# Patient Record
Sex: Male | Born: 1985 | Race: White | Hispanic: No | Smoking: Never smoker
Health system: Southern US, Community
[De-identification: ages and names within clinical notes are randomized; demographics above are authoritative.]

## PROBLEM LIST (undated history)

## (undated) DIAGNOSIS — M545 Low back pain, unspecified: Secondary | ICD-10-CM

## (undated) DIAGNOSIS — M79604 Pain in right leg: Secondary | ICD-10-CM

## (undated) DIAGNOSIS — M5417 Radiculopathy, lumbosacral region: Secondary | ICD-10-CM

## (undated) HISTORY — DX: Radiculopathy, lumbosacral region: M54.17

## (undated) HISTORY — DX: Pain in right leg: M79.604

## (undated) HISTORY — DX: Low back pain: M54.5

## (undated) HISTORY — DX: Low back pain, unspecified: M54.50

---

## 2019-03-02 ENCOUNTER — Other Ambulatory Visit: Payer: Self-pay | Admitting: Orthopedic Surgery

## 2019-03-03 ENCOUNTER — Encounter: Payer: Self-pay | Admitting: Vascular Surgery

## 2019-03-03 ENCOUNTER — Other Ambulatory Visit: Payer: Self-pay | Admitting: *Deleted

## 2019-03-08 ENCOUNTER — Telehealth (HOSPITAL_COMMUNITY): Payer: Self-pay | Admitting: Rehabilitation

## 2019-03-08 NOTE — Pre-Procedure Instructions (Addendum)
Ricky CopaKevin Norris  03/08/2019      [id number] - [place], [place] - [address] [facility] [address] [place] [id number] Phone: [phone] Fax: [phone]    Your procedure is scheduled on Wednesday, April 22nd.   [facility] at 5:30 A.M.  Call this number if you have problems the morning of surgery:  [phone]   Remember:  Do not eat after midnight.  You may drink clear liquids until 4:30 .  Clear liquids allowed are: Water, Juice (non-citric and without pulp), Carbonated beverages, Clear Tea, Black Coffee only, Plain Jell-O only and Gatorade  Please complete your PRE-SURGERY ENSURE that was provided to you 3 hours prior to you surgery start time.  Please, if able, drink it in one setting. DO NOT SIP.    Take these medicines the morning of surgery with A SIP OF WATER   Tramadol - if needed   7 days prior to surgery STOP taking any Aspirin (unless otherwise instructed by your surgeon), Aleve, Naproxen, Ibuprofen, Motrin, Advil, Goody's, BC's, all herbal medications, fish oil, and all vitamins.    Do not wear jewelry.  Do not wear lotions, powders, colognes, or deodorant.  Men may shave face and neck.  Do not bring valuables to the hospital.  [facility] is not responsible for any belongings or valuables.   [place]- Preparing For Surgery  Before surgery, you can play an important role. Because skin is not sterile, your skin needs to be as free of germs as possible. You can reduce the number of germs on your skin by washing with CHG (chlorahexidine gluconate) Soap before surgery.  CHG is an antiseptic cleaner which kills germs and bonds with the skin to continue killing germs even after washing.    Oral Hygiene is also important to reduce your risk of infection.  Remember - BRUSH YOUR TEETH THE MORNING OF SURGERY WITH YOUR REGULAR TOOTHPASTE  Please do not use if you have an allergy to CHG or  antibacterial soaps. If your skin becomes reddened/irritated stop using the CHG.  Do not shave (including legs and underarms) for at least 48 hours prior to first CHG shower. It is OK to shave your face.  Please follow these instructions carefully.   1. Shower the NIGHT BEFORE SURGERY and the MORNING OF SURGERY with CHG.   2. If you chose to wash your hair, wash your hair first as usual with your normal shampoo.  3. After you shampoo, rinse your hair and body thoroughly to remove the shampoo.  4. Use CHG as you would any other liquid soap. You can apply CHG directly to the skin and wash gently with a scrungie or a clean washcloth.   5. Apply the CHG Soap to your body ONLY FROM THE NECK DOWN.  Do not use on open wounds or open sores. Avoid contact with your eyes, ears, mouth and genitals (private parts). Wash Face and genitals (private parts)  with your normal soap.  6. Wash thoroughly, paying special attention to the area where your surgery will be performed.  7. Thoroughly rinse your body with warm water from the neck down.  8. DO NOT shower/wash with your normal soap after using and rinsing off the CHG Soap.  9. Pat yourself dry with a CLEAN TOWEL.  10. Wear CLEAN PAJAMAS to bed the night before surgery, wear comfortable clothes the morning of surgery  11. Place CLEAN  SHEETS on your bed the night of your first shower and DO NOT SLEEP WITH PETS.   Day of Surgery:  Do not apply any deodorants/lotions.  Please wear clean clothes to the hospital/surgery center.   Remember to brush your teeth WITH YOUR REGULAR TOOTHPASTE.   Contacts, dentures or bridgework may not be worn into surgery.  Leave your suitcase in the car.  After surgery it may be brought to your room.  For patients admitted to the hospital, discharge time will be determined by your treatment team.  Patients discharged the day of surgery will not be allowed to drive home.   Please read over the following fact sheets  that you were given. Coughing and Deep Breathing, MRSA Information and Surgical Site Infection Prevention

## 2019-03-08 NOTE — Telephone Encounter (Signed)
The above patient or their representative was contacted and gave the following answers to these questions:         Do you have any of the following symptoms? No  Fever                    Cough                   Shortness of breath  Do  you have any of the following other symptoms? No   muscle pain         vomiting,        diarrhea        rash         weakness        red eye        abdominal pain         bruising          bruising or bleeding              joint pain           severe headache    Have you been in contact with someone who was or has been sick in the past 2 weeks? No  Yes                 Unsure                         Unable to assess   Does the person that you were in contact with have any of the following symptoms?   Cough         shortness of breath           muscle pain         vomiting,            diarrhea            rash            weakness           fever            red eye           abdominal pain           bruising  or  bleeding                joint pain                severe headache               Have you  or someone you have been in contact with traveled internationally in th last month? No        If yes, which countries?   Have you  or someone you have been in contact with traveled outside [place] in th last month? No         If yes, which state and city?   COMMENTS OR ACTION PLAN FOR THIS PATIENT:          

## 2019-03-09 ENCOUNTER — Encounter (HOSPITAL_COMMUNITY)
Admission: RE | Admit: 2019-03-09 | Discharge: 2019-03-09 | Disposition: A | Discharge status: Home / Self Care | Payer: Commercial Managed Care - PPO | Source: Ambulatory Visit | Attending: Orthopedic Surgery | Admitting: Orthopedic Surgery

## 2019-03-09 ENCOUNTER — Other Ambulatory Visit: Payer: Self-pay

## 2019-03-09 ENCOUNTER — Ambulatory Visit (INDEPENDENT_AMBULATORY_CARE_PROVIDER_SITE_OTHER): Payer: Commercial Managed Care - PPO | Admitting: Vascular Surgery

## 2019-03-09 ENCOUNTER — Encounter (HOSPITAL_COMMUNITY): Payer: Self-pay

## 2019-03-09 ENCOUNTER — Encounter: Payer: Self-pay | Admitting: Vascular Surgery

## 2019-03-09 VITALS — BP 132/80 | HR 76 | Temp 97.3°F | Resp 20 | Ht 71.0 in | Wt 176.7 lb

## 2019-03-09 DIAGNOSIS — M5137 Other intervertebral disc degeneration, lumbosacral region: Secondary | ICD-10-CM

## 2019-03-09 DIAGNOSIS — Z01812 Encounter for preprocedural laboratory examination: Secondary | ICD-10-CM | POA: Insufficient documentation

## 2019-03-09 LAB — TYPE AND SCREEN
ABO/RH(D): B POS
Antibody Screen: NEGATIVE

## 2019-03-09 LAB — COMPREHENSIVE METABOLIC PANEL
ALT: 43 U/L (ref 0–44)
AST: 32 U/L (ref 15–41)
Albumin: 4.4 g/dL (ref 3.5–5.0)
Alkaline Phosphatase: 75 U/L (ref 38–126)
Anion gap: 10 (ref 5–15)
BUN: 7 mg/dL (ref 6–20)
CO2: 25 mmol/L (ref 22–32)
Calcium: 9.5 mg/dL (ref 8.9–10.3)
Chloride: 102 mmol/L (ref 98–111)
Creatinine, Ser: 0.98 mg/dL (ref 0.61–1.24)
GFR calc Af Amer: >60 mL/min (ref >60)
GFR calc non Af Amer: >60 mL/min (ref >60)
Glucose, Bld: 95 mg/dL (ref 70–99)
Potassium: 3.8 mmol/L (ref 3.5–5.1)
Sodium: 137 mmol/L (ref 135–145)
Total Bilirubin: 1 mg/dL (ref 0.3–1.2)
Total Protein: 7.7 g/dL (ref 6.5–8.1)

## 2019-03-09 LAB — CBC WITH DIFFERENTIAL/PLATELET
Abs Immature Granulocytes: 0.02 10*3/uL (ref 0.00–0.07)
Basophils Absolute: 0.1 10*3/uL (ref 0.0–0.1)
Basophils Relative: 1 %
Eosinophils Absolute: 0.1 10*3/uL (ref 0.0–0.5)
Eosinophils Relative: 1 %
HCT: 44.7 % (ref 39.0–52.0)
Hemoglobin: 15.6 g/dL (ref 13.0–17.0)
Immature Granulocytes: 0 %
Lymphocytes Relative: 20 %
Lymphs Abs: 1.6 10*3/uL (ref 0.7–4.0)
MCH: 32.3 pg (ref 26.0–34.0)
MCHC: 34.9 g/dL (ref 30.0–36.0)
MCV: 92.5 fL (ref 80.0–100.0)
Monocytes Absolute: 0.9 10*3/uL (ref 0.1–1.0)
Monocytes Relative: 11 %
Neutro Abs: 5.4 10*3/uL (ref 1.7–7.7)
Neutrophils Relative %: 67 %
Platelets: 353 10*3/uL (ref 150–400)
RBC: 4.83 MIL/uL (ref 4.22–5.81)
RDW: 12.4 % (ref 11.5–15.5)
WBC: 8 10*3/uL (ref 4.0–10.5)
nRBC: 0 % (ref 0.0–0.2)

## 2019-03-09 LAB — URINALYSIS, ROUTINE W REFLEX MICROSCOPIC
Bilirubin Urine: NEGATIVE
Glucose, UA: NEGATIVE mg/dL
Hgb urine dipstick: NEGATIVE
Ketones, ur: NEGATIVE mg/dL
Leukocytes,Ua: NEGATIVE
Nitrite: NEGATIVE
Protein, ur: NEGATIVE mg/dL
Specific Gravity, Urine: 1.018 (ref 1.005–1.030)
pH: 7 (ref 5.0–8.0)

## 2019-03-09 LAB — APTT: aPTT: 30 seconds (ref 24–36)

## 2019-03-09 LAB — ABO/RH: ABO/RH(D): B POS

## 2019-03-09 LAB — PROTIME-INR
INR: 1 (ref 0.8–1.2)
Prothrombin Time: 13 seconds (ref 11.4–15.2)

## 2019-03-09 NOTE — Progress Notes (Signed)
[facility]ist [facility] [place]  Patient name: Ricky Norris MRN: [id number] DOB: [birth year] Sex: male  REASON FOR CONSULT: Gus anterior exposure for L4-5 and L5-S1 disc surgery  HPI: Ricky Norris is a 33 y.o. male, who is here today for discussion of anterior exposure for lumbar fusion with Dr. Yevette Edwards.  He is very pleasant 33 year old gentleman who has had many years of progressive back pain.  This is now begin to have more pain in his right leg and is also now having progressive weakness in his right leg as well.  He has been seen by Dr. Yevette Edwards who is recommended both anterior and posterior instrumentation.  He is scheduled for L4-5 and L5-S1 anterior fusion tomorrow.  He has had no prior intra-abdominal surgery.  Did have surgery on his fingers as a child.  He works in a [employer] and is makes it very difficult for him to do his job with his back pain and right leg pain and weakness.  Past Medical History:  Diagnosis Date  . Low back pain   . 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   . Right leg pain     History reviewed. No pertinent family history.  SOCIAL HISTORY: Social History   Socioeconomic History  . Marital status: Divorced    Spouse name: Not on file  . Number of children: Not on file  . Years of education: Not on file  . Highest education level: Not on file  Occupational History  . Not on file  Social Needs  . Financial resource strain: Not on file  . Food insecurity:    Worry: Not on file    Inability: Not on file  . Transportation needs:    Medical: Not on file    Non-medical: Not on file  Tobacco Use  . Smoking status: Never Smoker  . Smokeless tobacco: Never Used  Substance and Sexual Activity  . Alcohol use: Yes  . Drug use: Yes    Types: Marijuana  . Sexual activity: Not on file  Lifestyle  . Physical  activity:    Days per week: Not on file    Minutes per session: Not on file  . Stress: Not on file  Relationships  . Social connections:    Talks on phone: Not on file    Gets together: Not on file    Attends religious service: Not on file    Active member of club or organization: Not on file    Attends meetings of clubs or organizations: Not on file    Relationship status: Not on file  . Intimate partner violence:    Fear of current or ex partner: Not on file    Emotionally abused: Not on file    Physically abused: Not on file    Forced sexual activity: Not on file  Other Topics Concern  . Not on file  Social History Narrative  . Not on file    No Known Allergies  Current Outpatient Medications  Medication Sig Dispense Refill  . methocarbamol (ROBAXIN) 500 MG tablet Take 500 mg by mouth daily.     . traMADol (ULTRAM) 50 MG tablet Take 50 mg by mouth daily.      No current facility-administered medications for this visit.     REVIEW OF SYSTEMS:  [X]  denotes positive finding, [ ]  denotes negative finding Cardiac  Comments:  Chest  pain or chest pressure:    Shortness of breath upon exertion:    Short of breath when lying flat:    Irregular heart rhythm:        Vascular    Pain in calf, thigh, or hip brought on by ambulation:    Pain in feet at night that wakes you up from your sleep:     Blood clot in your veins:    Leg swelling:         Pulmonary    Oxygen at home:    Productive cough:     Wheezing:         Neurologic    Sudden weakness in arms or legs:     Sudden numbness in arms or legs:     Sudden onset of difficulty speaking or slurred speech:    Temporary loss of vision in one eye:     Problems with dizziness:         Gastrointestinal    Blood in stool:     Vomited blood:         Genitourinary    Burning when urinating:     Blood in urine:        Psychiatric    Major depression:         Hematologic    Bleeding problems:    Problems with  blood clotting too easily:        Skin    Rashes or ulcers:        Constitutional    Fever or chills:      PHYSICAL EXAM: Vitals:   03/09/19 0814  BP: 132/80  Pulse: 76  Resp: 20  Temp: (!) 97.3 F (36.3 C)  SpO2: 96%  Weight: 176 lb 11.2 oz (80.2 kg)  Height: 5\' 11"  (1.803 m)    GENERAL: The patient is a well-nourished male, in no acute distress. The vital signs are documented above. CARDIOVASCULAR: 2+ radial and 2+ dorsalis pedis pulses bilaterally PULMONARY: There is good air exchange  ABDOMEN: Soft and non-tender  MUSCULOSKELETAL: There are no major deformities or cyanosis. NEUROLOGIC: No focal weakness or paresthesias are detected. SKIN: There are no ulcers or rashes noted. PSYCHIATRIC: The patient has a normal affect.  DATA:  None  MEDICAL ISSUES: I discussed operative procedure with the patient.  Explained the Dr. Yevette Edwardsumonski is determined that he would be best treated with surgery and that component of this would be related to anterior exposure.  I explained the incision and mobilization of the rectus muscle, retroperitoneal contents, left ureter and arterial and venous structures overlying the spine.  Discussed the potential to all these and in particular discussed venous injury.  Also discussed retrograde ejaculation which he understands the slight risk of this.  He is ready to proceed with surgery as scheduled.  We will plan this tomorrow.   Larina Earthlyodd F. , MD FACS [facility] Office Tel [phone] Pager [phone]

## 2019-03-09 NOTE — Progress Notes (Signed)
PCP - Denies  Cardiologist - Denies  Chest x-ray - NA  EKG - NA  Stress Test - Denies  ECHO - Denies  Cardiac Cath - Denies  AICD-Denies PM-Denies LOOP-Denies  Sleep Study - Denies CPAP - NA  LABS-CBC,CMP,T/S,UA,PT,PTT  ASA-Denies  HA1C-NA Fasting Blood Sugar -  Checks Blood Sugar _____ times a day  Anesthesia-N  ERAS-Ensure given to patient with instructions.Pt stated he does not have any family or friends to pick him up after discharge. RN advised pt that it would be a good idea to have someone pick him up at time of discharge and to make arrangements for the same. Pt denies having chest pain, sob, or fever at this time. All instructions explained to the pt, with a verbal understanding of the material. Pt agrees to go over the instructions while at home for a better understanding. The opportunity to ask questions was provided.

## 2019-03-09 NOTE — Progress Notes (Addendum)

## 2019-03-09 NOTE — Anesthesia Preprocedure Evaluation (Addendum)
Anesthesia Evaluation    Reviewed: Allergy & Precautions, Patient's Chart, lab work & pertinent test results  Airway Mallampati: II  TM Distance: >3 FB Neck ROM: Full    Dental no notable dental hx.    Pulmonary neg pulmonary ROS,    Pulmonary exam normal breath sounds clear to auscultation       Cardiovascular Exercise Tolerance: Good negative cardio ROS Normal cardiovascular exam Rhythm:Regular Rate:Normal     Neuro/Psych R leg ridiculopathy  Neuromuscular disease    GI/Hepatic negative GI ROS, Neg liver ROS,   Endo/Other  negative endocrine ROS  Renal/GU negative Renal ROS     Musculoskeletal   Abdominal   Peds  Hematology   Anesthesia Other Findings   Reproductive/Obstetrics                            Anesthesia Physical Anesthesia Plan  ASA: II  Anesthesia Plan: General   Post-op Pain Management:    Induction: Cricoid pressure planned, Rapid sequence and Intravenous  PONV Risk Score and Plan: 2 and Treatment may vary due to age or medical condition, Ondansetron, Dexamethasone and Midazolam  Airway Management Planned: Oral ETT  Additional Equipment:   Intra-op Plan:   Post-operative Plan: Extubation in OR  Informed Consent:     Dental advisory given  Plan Discussed with:   Anesthesia Plan Comments: (GA)        Anesthesia Quick Evaluation

## 2019-03-10 ENCOUNTER — Inpatient Hospital Stay (HOSPITAL_COMMUNITY)
Admission: RE | Admit: 2019-03-10 | Discharge: 2019-03-12 | DRG: 455 | Disposition: A | Discharge status: Home / Self Care | Payer: Commercial Managed Care - PPO | Attending: Orthopedic Surgery | Admitting: Orthopedic Surgery

## 2019-03-10 ENCOUNTER — Inpatient Hospital Stay (HOSPITAL_COMMUNITY): Payer: Commercial Managed Care - PPO | Admitting: Anesthesiology

## 2019-03-10 ENCOUNTER — Encounter (HOSPITAL_COMMUNITY)
Admission: RE | Disposition: A | Discharge status: Home / Self Care | Payer: Self-pay | Source: Home / Self Care | Attending: Orthopedic Surgery

## 2019-03-10 ENCOUNTER — Inpatient Hospital Stay (HOSPITAL_COMMUNITY): Payer: Commercial Managed Care - PPO

## 2019-03-10 ENCOUNTER — Encounter (HOSPITAL_COMMUNITY): Payer: Self-pay | Admitting: *Deleted

## 2019-03-10 DIAGNOSIS — Z79899 Other long term (current) drug therapy: Secondary | ICD-10-CM | POA: Diagnosis not present

## 2019-03-10 DIAGNOSIS — M545 Low back pain: Secondary | ICD-10-CM | POA: Diagnosis present

## 2019-03-10 DIAGNOSIS — M5416 Radiculopathy, lumbar region: Principal | ICD-10-CM | POA: Diagnosis present

## 2019-03-10 DIAGNOSIS — M4807 Spinal stenosis, lumbosacral region: Secondary | ICD-10-CM | POA: Diagnosis present

## 2019-03-10 DIAGNOSIS — M5137 Other intervertebral disc degeneration, lumbosacral region: Secondary | ICD-10-CM | POA: Diagnosis not present

## 2019-03-10 DIAGNOSIS — M4316 Spondylolisthesis, lumbar region: Secondary | ICD-10-CM | POA: Diagnosis present

## 2019-03-10 DIAGNOSIS — M4317 Spondylolisthesis, lumbosacral region: Secondary | ICD-10-CM | POA: Diagnosis present

## 2019-03-10 DIAGNOSIS — M48061 Spinal stenosis, lumbar region without neurogenic claudication: Secondary | ICD-10-CM | POA: Diagnosis present

## 2019-03-10 DIAGNOSIS — M5136 Other intervertebral disc degeneration, lumbar region: Secondary | ICD-10-CM | POA: Diagnosis not present

## 2019-03-10 DIAGNOSIS — M5410 Radiculopathy, site unspecified: Secondary | ICD-10-CM | POA: Diagnosis present

## 2019-03-10 DIAGNOSIS — Z419 Encounter for procedure for purposes other than remedying health state, unspecified: Secondary | ICD-10-CM

## 2019-03-10 HISTORY — PX: ABDOMINAL EXPOSURE: SHX5708

## 2019-03-10 HISTORY — PX: ANTERIOR LUMBAR FUSION: SHX1170

## 2019-03-10 SURGERY — ANTERIOR LUMBAR FUSION 2 LEVELS
Anesthesia: General | Site: Spine Lumbar

## 2019-03-10 MED ORDER — KETAMINE HCL 50 MG/5ML IJ SOSY
PREFILLED_SYRINGE | INTRAMUSCULAR | Status: AC
  Filled 2019-03-10: qty 5

## 2019-03-10 MED ORDER — SODIUM CHLORIDE 0.9% FLUSH: 3.0000 mL | INTRAVENOUS | Status: DC | PRN

## 2019-03-10 MED ORDER — THROMBIN 20000 UNITS EX SOLR
CUTANEOUS | Status: DC | PRN
  Administered 2019-03-10: 20000 [IU] via TOPICAL

## 2019-03-10 MED ORDER — ONDANSETRON HCL 4 MG/2ML IJ SOLN
INTRAMUSCULAR | Status: DC | PRN
  Administered 2019-03-10: 4 mg via INTRAVENOUS

## 2019-03-10 MED ORDER — GABAPENTIN 300 MG PO CAPS
300.0000 mg | ORAL_CAPSULE | Freq: Once | ORAL | Status: AC
  Administered 2019-03-10: 06:00:00 300 mg via ORAL
  Filled 2019-03-10: qty 1

## 2019-03-10 MED ORDER — ONDANSETRON HCL 4 MG/2ML IJ SOLN: 4.0000 mg | Freq: Once | INTRAMUSCULAR | Status: DC | PRN

## 2019-03-10 MED ORDER — LIDOCAINE HCL (CARDIAC) PF 100 MG/5ML IV SOSY
PREFILLED_SYRINGE | INTRAVENOUS | Status: DC | PRN
  Administered 2019-03-10: 60 mg via INTRAVENOUS

## 2019-03-10 MED ORDER — ACETAMINOPHEN 325 MG PO TABS
650.0000 mg | ORAL_TABLET | ORAL | Status: DC | PRN
  Administered 2019-03-10: 650 mg via ORAL
  Filled 2019-03-10: qty 2

## 2019-03-10 MED ORDER — ALBUMIN HUMAN 5 % IV SOLN
INTRAVENOUS | Status: DC | PRN
  Administered 2019-03-10: 11:00:00 via INTRAVENOUS

## 2019-03-10 MED ORDER — 0.9 % SODIUM CHLORIDE (POUR BTL) OPTIME
TOPICAL | Status: DC | PRN
  Administered 2019-03-10 (×2): 1000 mL

## 2019-03-10 MED ORDER — ONDANSETRON HCL 4 MG PO TABS: 4.0000 mg | ORAL_TABLET | Freq: Four times a day (QID) | ORAL | Status: DC | PRN

## 2019-03-10 MED ORDER — SUGAMMADEX SODIUM 200 MG/2ML IV SOLN
INTRAVENOUS | Status: DC | PRN
  Administered 2019-03-10: 200 mg via INTRAVENOUS

## 2019-03-10 MED ORDER — FENTANYL CITRATE (PF) 250 MCG/5ML IJ SOLN
INTRAMUSCULAR | Status: AC
  Filled 2019-03-10: qty 5

## 2019-03-10 MED ORDER — KETAMINE HCL 10 MG/ML IJ SOLN
INTRAMUSCULAR | Status: DC | PRN
  Administered 2019-03-10: 10 mg via INTRAVENOUS
  Administered 2019-03-10: 20 mg via INTRAVENOUS
  Administered 2019-03-10 (×2): 10 mg via INTRAVENOUS

## 2019-03-10 MED ORDER — OXYCODONE-ACETAMINOPHEN 5-325 MG PO TABS
1.0000 | ORAL_TABLET | ORAL | Status: DC | PRN
  Administered 2019-03-10 – 2019-03-12 (×6): 2 via ORAL
  Filled 2019-03-10 (×7): qty 2

## 2019-03-10 MED ORDER — THROMBIN 20000 UNITS EX SOLR: CUTANEOUS | Status: DC | PRN

## 2019-03-10 MED ORDER — DOCUSATE SODIUM 100 MG PO CAPS
100.0000 mg | ORAL_CAPSULE | Freq: Two times a day (BID) | ORAL | Status: DC
  Administered 2019-03-10 – 2019-03-12 (×3): 100 mg via ORAL
  Filled 2019-03-10 (×3): qty 1

## 2019-03-10 MED ORDER — SENNOSIDES-DOCUSATE SODIUM 8.6-50 MG PO TABS: 1.0000 | ORAL_TABLET | Freq: Every evening | ORAL | Status: DC | PRN

## 2019-03-10 MED ORDER — HYDROCODONE-ACETAMINOPHEN 7.5-325 MG PO TABS: 1.0000 | ORAL_TABLET | Freq: Once | ORAL | Status: DC | PRN

## 2019-03-10 MED ORDER — DEXAMETHASONE SODIUM PHOSPHATE 4 MG/ML IJ SOLN
INTRAMUSCULAR | Status: DC | PRN
  Administered 2019-03-10: 10 mg via INTRAVENOUS

## 2019-03-10 MED ORDER — MENTHOL 3 MG MT LOZG: 1.0000 | LOZENGE | OROMUCOSAL | Status: DC | PRN

## 2019-03-10 MED ORDER — PROPOFOL 10 MG/ML IV BOLUS
INTRAVENOUS | Status: AC
  Filled 2019-03-10: qty 20

## 2019-03-10 MED ORDER — ACETAMINOPHEN 500 MG PO TABS
1000.0000 mg | ORAL_TABLET | Freq: Once | ORAL | Status: AC
  Administered 2019-03-10: 1000 mg via ORAL
  Filled 2019-03-10: qty 2

## 2019-03-10 MED ORDER — ALUM & MAG HYDROXIDE-SIMETH 200-200-20 MG/5ML PO SUSP: 30.0000 mL | Freq: Four times a day (QID) | ORAL | Status: DC | PRN

## 2019-03-10 MED ORDER — CEFAZOLIN SODIUM-DEXTROSE 2-4 GM/100ML-% IV SOLN
2.0000 g | INTRAVENOUS | Status: AC
  Administered 2019-03-10 (×2): 2 g via INTRAVENOUS
  Filled 2019-03-10: qty 100

## 2019-03-10 MED ORDER — BUPIVACAINE-EPINEPHRINE 0.25% -1:200000 IJ SOLN: INTRAMUSCULAR | Status: DC | PRN

## 2019-03-10 MED ORDER — FLEET ENEMA 7-19 GM/118ML RE ENEM: 1.0000 | ENEMA | Freq: Once | RECTAL | Status: DC | PRN

## 2019-03-10 MED ORDER — MIDAZOLAM HCL 5 MG/5ML IJ SOLN
INTRAMUSCULAR | Status: DC | PRN
  Administered 2019-03-10: 2 mg via INTRAVENOUS

## 2019-03-10 MED ORDER — HYDROMORPHONE HCL 1 MG/ML IJ SOLN
0.2500 mg | INTRAMUSCULAR | Status: DC | PRN
  Administered 2019-03-10 (×2): 0.5 mg via INTRAVENOUS

## 2019-03-10 MED ORDER — BISACODYL 5 MG PO TBEC: 5.0000 mg | DELAYED_RELEASE_TABLET | Freq: Every day | ORAL | Status: DC | PRN

## 2019-03-10 MED ORDER — CEFAZOLIN SODIUM-DEXTROSE 2-4 GM/100ML-% IV SOLN
2.0000 g | Freq: Three times a day (TID) | INTRAVENOUS | Status: AC
  Administered 2019-03-10 – 2019-03-11 (×2): 2 g via INTRAVENOUS
  Filled 2019-03-10 (×2): qty 100

## 2019-03-10 MED ORDER — PROPOFOL 10 MG/ML IV BOLUS
INTRAVENOUS | Status: DC | PRN
  Administered 2019-03-10: 200 mg via INTRAVENOUS

## 2019-03-10 MED ORDER — SODIUM CHLORIDE 0.9% FLUSH
3.0000 mL | Freq: Two times a day (BID) | INTRAVENOUS | Status: DC
  Administered 2019-03-10 – 2019-03-12 (×3): 3 mL via INTRAVENOUS

## 2019-03-10 MED ORDER — THROMBIN 20000 UNITS EX SOLR
CUTANEOUS | Status: AC
  Filled 2019-03-10: qty 20000

## 2019-03-10 MED ORDER — SODIUM CHLORIDE 0.9 % IV SOLN: 250.0000 mL | INTRAVENOUS | Status: DC

## 2019-03-10 MED ORDER — BUPIVACAINE-EPINEPHRINE (PF) 0.25% -1:200000 IJ SOLN
INTRAMUSCULAR | Status: AC
  Filled 2019-03-10: qty 30

## 2019-03-10 MED ORDER — LACTATED RINGERS IV SOLN
INTRAVENOUS | Status: DC | PRN
  Administered 2019-03-10: 07:00:00 via INTRAVENOUS

## 2019-03-10 MED ORDER — LACTATED RINGERS IV SOLN
INTRAVENOUS | Status: DC | PRN
  Administered 2019-03-10 (×2): via INTRAVENOUS

## 2019-03-10 MED ORDER — SUCCINYLCHOLINE CHLORIDE 20 MG/ML IJ SOLN
INTRAMUSCULAR | Status: DC | PRN
  Administered 2019-03-10: 120 mg via INTRAVENOUS

## 2019-03-10 MED ORDER — ROCURONIUM BROMIDE 100 MG/10ML IV SOLN
INTRAVENOUS | Status: DC | PRN
  Administered 2019-03-10: 30 mg via INTRAVENOUS
  Administered 2019-03-10: 50 mg via INTRAVENOUS
  Administered 2019-03-10: 20 mg via INTRAVENOUS

## 2019-03-10 MED ORDER — FENTANYL CITRATE (PF) 100 MCG/2ML IJ SOLN
INTRAMUSCULAR | Status: DC | PRN
  Administered 2019-03-10 (×7): 50 ug via INTRAVENOUS
  Administered 2019-03-10: 100 ug via INTRAVENOUS
  Administered 2019-03-10: 50 ug via INTRAVENOUS

## 2019-03-10 MED ORDER — POTASSIUM CHLORIDE IN NACL 20-0.9 MEQ/L-% IV SOLN
INTRAVENOUS | Status: DC
  Administered 2019-03-10: 18:00:00 via INTRAVENOUS
  Filled 2019-03-10: qty 1000

## 2019-03-10 MED ORDER — MORPHINE SULFATE (PF) 2 MG/ML IV SOLN: 1.0000 mg | INTRAVENOUS | Status: DC | PRN

## 2019-03-10 MED ORDER — HEMOSTATIC AGENTS (NO CHARGE) OPTIME
TOPICAL | Status: DC | PRN
  Administered 2019-03-10: 1 via TOPICAL

## 2019-03-10 MED ORDER — CHLORHEXIDINE GLUCONATE 4 % EX LIQD: 60.0000 mL | Freq: Once | CUTANEOUS | Status: DC

## 2019-03-10 MED ORDER — ACETAMINOPHEN 650 MG RE SUPP: 650.0000 mg | RECTAL | Status: DC | PRN

## 2019-03-10 MED ORDER — PROPOFOL 500 MG/50ML IV EMUL
INTRAVENOUS | Status: DC | PRN
  Administered 2019-03-10: 50 ug/kg/min via INTRAVENOUS

## 2019-03-10 MED ORDER — ESMOLOL HCL 100 MG/10ML IV SOLN
INTRAVENOUS | Status: AC
  Filled 2019-03-10: qty 10

## 2019-03-10 MED ORDER — ONDANSETRON HCL 4 MG/2ML IJ SOLN
4.0000 mg | Freq: Four times a day (QID) | INTRAMUSCULAR | Status: DC | PRN
  Administered 2019-03-10: 18:00:00 4 mg via INTRAVENOUS
  Filled 2019-03-10: qty 2

## 2019-03-10 MED ORDER — POVIDONE-IODINE 7.5 % EX SOLN
Freq: Once | CUTANEOUS | Status: AC
  Administered 2019-03-10: 06:00:00 via TOPICAL
  Filled 2019-03-10: qty 118

## 2019-03-10 MED ORDER — MIDAZOLAM HCL 2 MG/2ML IJ SOLN
INTRAMUSCULAR | Status: AC
  Filled 2019-03-10: qty 2

## 2019-03-10 MED ORDER — HYDROMORPHONE HCL 1 MG/ML IJ SOLN
INTRAMUSCULAR | Status: AC
  Filled 2019-03-10: qty 1

## 2019-03-10 MED ORDER — ZOLPIDEM TARTRATE 5 MG PO TABS: 5.0000 mg | ORAL_TABLET | Freq: Every evening | ORAL | Status: DC | PRN

## 2019-03-10 MED ORDER — PHENOL 1.4 % MT LIQD: 1.0000 | OROMUCOSAL | Status: DC | PRN

## 2019-03-10 MED ORDER — MEPERIDINE HCL 50 MG/ML IJ SOLN: 6.2500 mg | INTRAMUSCULAR | Status: DC | PRN

## 2019-03-10 MED ORDER — DIAZEPAM 5 MG PO TABS
5.0000 mg | ORAL_TABLET | Freq: Four times a day (QID) | ORAL | Status: DC | PRN
  Administered 2019-03-10 – 2019-03-12 (×5): 5 mg via ORAL
  Filled 2019-03-10 (×4): qty 1

## 2019-03-10 SURGICAL SUPPLY — 96 items
APPLIER CLIP 11 MED OPEN (CLIP) ×6
BENZOIN TINCTURE PRP APPL 2/3 (GAUZE/BANDAGES/DRESSINGS) ×3 IMPLANT
BLADE CLIPPER SURG (BLADE) IMPLANT
BLADE SURG 10 STRL SS (BLADE) ×3 IMPLANT
CLIP APPLIE 11 MED OPEN (CLIP) ×4 IMPLANT
CLIP LIGATING EXTRA MED SLVR (CLIP) ×3 IMPLANT
CLIP LIGATING EXTRA SM BLUE (MISCELLANEOUS) ×3 IMPLANT
CORDS BIPOLAR (ELECTRODE) ×3 IMPLANT
COVER SURGICAL LIGHT HANDLE (MISCELLANEOUS) ×3 IMPLANT
COVER WAND RF STERILE (DRAPES) ×6 IMPLANT
DERMABOND ADVANCED (GAUZE/BANDAGES/DRESSINGS)
DERMABOND ADVANCED .7 DNX12 (GAUZE/BANDAGES/DRESSINGS) IMPLANT
DRAPE C-ARM 42X72 X-RAY (DRAPES) ×6 IMPLANT
DRAPE C-ARMOR (DRAPES) ×3 IMPLANT
DRAPE POUCH INSTRU U-SHP 10X18 (DRAPES) ×3 IMPLANT
DRAPE SURG 17X23 STRL (DRAPES) ×9 IMPLANT
DRSG MEPILEX BORDER 4X12 (GAUZE/BANDAGES/DRESSINGS) ×3 IMPLANT
DURAPREP 26ML APPLICATOR (WOUND CARE) ×3 IMPLANT
ELECT BLADE 4.0 EZ CLEAN MEGAD (MISCELLANEOUS) ×6
ELECT CAUTERY BLADE 6.4 (BLADE) ×3 IMPLANT
ELECT REM PT RETURN 9FT ADLT (ELECTROSURGICAL) ×3
ELECTRODE BLDE 4.0 EZ CLN MEGD (MISCELLANEOUS) ×4 IMPLANT
ELECTRODE REM PT RTRN 9FT ADLT (ELECTROSURGICAL) ×2 IMPLANT
FEE INTRAOP MONITOR IMPULS NCS (MISCELLANEOUS) ×2 IMPLANT
GAUZE 4X4 16PLY RFD (DISPOSABLE) IMPLANT
GAUZE SPONGE 4X4 12PLY STRL LF (GAUZE/BANDAGES/DRESSINGS) ×3 IMPLANT
GLOVE BIO SURGEON STRL SZ7 (GLOVE) ×3 IMPLANT
GLOVE BIO SURGEON STRL SZ8 (GLOVE) ×3 IMPLANT
GLOVE BIOGEL PI IND STRL 7.0 (GLOVE) ×2 IMPLANT
GLOVE BIOGEL PI IND STRL 8 (GLOVE) ×4 IMPLANT
GLOVE BIOGEL PI INDICATOR 7.0 (GLOVE) ×1
GLOVE BIOGEL PI INDICATOR 8 (GLOVE) ×2
GLOVE INDICATOR 7.5 STRL GRN (GLOVE) ×3 IMPLANT
GLOVE SS BIOGEL STRL SZ 7.5 (GLOVE) ×2 IMPLANT
GLOVE SUPERSENSE BIOGEL SZ 7.5 (GLOVE) ×1
GLOVE SURG SS PI 6.5 STRL IVOR (GLOVE) ×3 IMPLANT
GOWN STRL REUS W/ TWL LRG LVL3 (GOWN DISPOSABLE) ×6 IMPLANT
GOWN STRL REUS W/ TWL XL LVL3 (GOWN DISPOSABLE) ×6 IMPLANT
GOWN STRL REUS W/TWL LRG LVL3 (GOWN DISPOSABLE) ×3
GOWN STRL REUS W/TWL XL LVL3 (GOWN DISPOSABLE) ×3
HEMOSTAT SURGICEL 2X14 (HEMOSTASIS) IMPLANT
INSERT FOGARTY 61MM (MISCELLANEOUS) IMPLANT
INSERT FOGARTY SM (MISCELLANEOUS) IMPLANT
INTRAOP MONITOR FEE IMPULS NCS (MISCELLANEOUS) ×2
INTRAOP MONITOR FEE IMPULSE (MISCELLANEOUS) ×1
KIT BASIN OR (CUSTOM PROCEDURE TRAY) ×3 IMPLANT
KIT TURNOVER KIT B (KITS) ×3 IMPLANT
LOOP VESSEL MAXI BLUE (MISCELLANEOUS) IMPLANT
LOOP VESSEL MINI RED (MISCELLANEOUS) IMPLANT
MIX DBX 10CC 35% BONE (Bone Implant) ×6 IMPLANT
NEEDLE HYPO 25GX1X1/2 BEV (NEEDLE) ×3 IMPLANT
NEEDLE SPNL 18GX3.5 QUINCKE PK (NEEDLE) ×3 IMPLANT
NS IRRIG 1000ML POUR BTL (IV SOLUTION) ×3 IMPLANT
PACK LAMINECTOMY ORTHO (CUSTOM PROCEDURE TRAY) IMPLANT
PACK UNIVERSAL I (CUSTOM PROCEDURE TRAY) ×3 IMPLANT
PAD ARMBOARD 7.5X6 YLW CONV (MISCELLANEOUS) ×6 IMPLANT
PATTIES SURGICAL .5 X1 (DISPOSABLE) ×3 IMPLANT
SPONGE INTESTINAL PEANUT (DISPOSABLE) ×9 IMPLANT
SPONGE LAP 18X18 RF (DISPOSABLE) IMPLANT
SPONGE LAP 18X18 X RAY DECT (DISPOSABLE) ×3 IMPLANT
SPONGE LAP 4X18 RFD (DISPOSABLE) IMPLANT
SPONGE SURGIFOAM ABS GEL 100 (HEMOSTASIS) ×3 IMPLANT
STAPLER VISISTAT 35W (STAPLE) IMPLANT
STRIP CLOSURE SKIN 1/2X4 (GAUZE/BANDAGES/DRESSINGS) ×3 IMPLANT
SURGIFLO W/THROMBIN 8M KIT (HEMOSTASIS) IMPLANT
SUT MNCRL AB 4-0 PS2 18 (SUTURE) ×3 IMPLANT
SUT PDS AB 1 CTX 36 (SUTURE) ×6 IMPLANT
SUT PROLENE 4 0 RB 1 (SUTURE)
SUT PROLENE 4-0 RB1 .5 CRCL 36 (SUTURE) IMPLANT
SUT PROLENE 5 0 C 1 24 (SUTURE) IMPLANT
SUT PROLENE 5 0 CC1 (SUTURE) IMPLANT
SUT PROLENE 6 0 C 1 30 (SUTURE) ×3 IMPLANT
SUT PROLENE 6 0 CC (SUTURE) IMPLANT
SUT SILK 0 TIES 10X30 (SUTURE) ×3 IMPLANT
SUT SILK 2 0 TIES 10X30 (SUTURE) ×6 IMPLANT
SUT SILK 2 0SH CR/8 30 (SUTURE) IMPLANT
SUT SILK 3 0 TIES 10X30 (SUTURE) ×6 IMPLANT
SUT SILK 3 0SH CR/8 30 (SUTURE) IMPLANT
SUT VIC AB 1 CT1 18XCR BRD 8 (SUTURE) ×2 IMPLANT
SUT VIC AB 1 CT1 27 (SUTURE) ×2
SUT VIC AB 1 CT1 27XBRD ANBCTR (SUTURE) ×4 IMPLANT
SUT VIC AB 1 CT1 8-18 (SUTURE) ×1
SUT VIC AB 1 CTX 36 (SUTURE) ×2
SUT VIC AB 1 CTX36XBRD ANBCTR (SUTURE) ×4 IMPLANT
SUT VIC AB 2-0 CT2 18 VCP726D (SUTURE) ×6 IMPLANT
SUT VIC AB 3-0 SH 27 (SUTURE) ×1
SUT VIC AB 3-0 SH 27X BRD (SUTURE) ×2 IMPLANT
SYNCAGE EVOL MD 12X10.1 6D (Spacer) ×3 IMPLANT
SYNCAGE EVOL SM 17X12.4 14D (Spacer) ×3 IMPLANT
SYR BULB IRRIGATION 50ML (SYRINGE) ×3 IMPLANT
TOWEL GREEN STERILE (TOWEL DISPOSABLE) ×3 IMPLANT
TOWEL OR 17X24 6PK STRL BLUE (TOWEL DISPOSABLE) ×3 IMPLANT
TOWEL OR 17X26 10 PK STRL BLUE (TOWEL DISPOSABLE) ×3 IMPLANT
TRAY FOLEY CATH SILVER 16FR (SET/KITS/TRAYS/PACK) ×3 IMPLANT
WATER STERILE IRR 1000ML POUR (IV SOLUTION) ×3 IMPLANT
YANKAUER SUCT BULB TIP NO VENT (SUCTIONS) ×3 IMPLANT

## 2019-03-10 NOTE — Anesthesia Procedure Notes (Signed)
Procedure Name: Intubation Date/Time: 03/10/2019 7:46 AM Performed by: Tillman Abide, CRNA Pre-anesthesia Checklist: Patient identified, Emergency Drugs available, Suction available and Patient being monitored Patient Re-evaluated:Patient Re-evaluated prior to induction Oxygen Delivery Method: Circle System Utilized Preoxygenation: Pre-oxygenation with 100% oxygen Induction Type: IV induction Ventilation: Mask ventilation without difficulty Laryngoscope Size: Miller and 3 Tube type: Oral Tube size: 7.5 mm Number of attempts: 1 Airway Equipment and Method: Stylet Placement Confirmation: ETT inserted through vocal cords under direct vision,  positive ETCO2 and breath sounds checked- equal and bilateral Secured at: 23 cm Tube secured with: Tape Dental Injury: Teeth and Oropharynx as per pre-operative assessment  Comments: Retainer removed prior to induction and placed with patient belongings.

## 2019-03-10 NOTE — Op Note (Signed)
    OPERATIVE REPORT  DATE OF SURGERY: 03/10/2019  PATIENT: Ricky Norris, 33 y.o. male MRN: [id number]  DOB: 1986-06-19  PRE-OPERATIVE DIAGNOSIS: Degenerative disc disease  POST-OPERATIVE DIAGNOSIS:  Same  PROCEDURE: Anterior exposure for L4-5 and L5-S1 disc fusion  SURGEON:  Gretta Began, M.D.  Co-surgeon for the exposure Dr. Estill Bamberg  ANESTHESIA: General  EBL: per anesthesia record  Total I/O In: 2350 [I.V.:2100; IV Piggyback:250] Out: 590 [Urine:440; Blood:150]  BLOOD ADMINISTERED: none  DRAINS: none  SPECIMEN: none  COUNTS CORRECT:  YES  PATIENT DISPOSITION:  PACU - hemodynamically stable  PROCEDURE DETAILS: Patient was taken operating placed supine position where the area of the abdomen was prepped draped you sterile fashion.  Crosstable lateral C arm revealed the level of the L4-5 and L5-S1 disc.  A left paramedian incision was made and carried down through the subcutaneous fat with electrocautery.  The fat was mobilized off the anterior rectus sheath.  The anterior rectus sheath was opened in line with the skin incision and the rectus muscle was mobilized circumferentially.  The retroperitoneal space was entered bluntly laterally and the intraperitoneal contents were mobilized to the right.  Iliac the left ureter was identified and preserved.  Dissection was continued down to the level of the L5-S1 disc.  The middle sacral vessels were clipped and divided.  Blunt dissection was used to mobilize the tissue over the L5-S1 disc and adequate exposure was obtained.  Next the L4-5 disc was approached to the left of the left iliac artery and vein.  The patient had 2 iliolumbar veins and these were ligated with 2-0 silk ties and divided.  The Thompson retractor was brought into the field and was positioned at the level of the L 5 S1 disc.  The discectomy and fusion will be dictated as a separate note by Dr. Yevette Edwards.  I then scrubbed back into the case and repositioned the  retractor for the L4-5 disc.  The remainder of the procedure will be dictated as a separate note by Dr. Ivan Croft, M.D., [facility] 03/10/2019 3:14 PM

## 2019-03-10 NOTE — Plan of Care (Signed)

## 2019-03-10 NOTE — OR Nursing (Signed)
Dr. Chilton Si from Radiology called to discuss the results of the Abdominal x-ray.  He stated that there was an area of note around the left iliac area of the pelvis. He did not state that he wanted to do another x-ray.

## 2019-03-10 NOTE — H&P (Signed)
PREOPERATIVE H&P  Chief Complaint: Low back pain, right leg pain and weakness  HPI: Ricky Norris is a 33 y.o. male who presents with ongoing progressive weakness and pain in the right leg  MRI reveals very severe NF stenosis at L5/S1 with a grade 3 spondylolisthesis at L5/S1  Patient has failed multiple forms of conservative care and continues to have pain (see office notes for additional details regarding the patient's full course of treatment)  Past Medical History:  Diagnosis Date  . Low back pain   . 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   . Right leg pain    History reviewed. No pertinent surgical history. Social History   Socioeconomic History  . Marital status: Divorced    Spouse name: Not on file  . Number of children: Not on file  . Years of education: Not on file  . Highest education level: Not on file  Occupational History  . Not on file  Social Needs  . Financial resource strain: Not on file  . Food insecurity:    Worry: Not on file    Inability: Not on file  . Transportation needs:    Medical: Not on file    Non-medical: Not on file  Tobacco Use  . Smoking status: Never Smoker  . Smokeless tobacco: Never Used  Substance and Sexual Activity  . Alcohol use: Yes    Alcohol/week: 12.0 standard drinks    Types: 12 Cans of beer per week  . Drug use: Yes    Types: Marijuana    Comment: Last  03/08/19  . Sexual activity: Not on file  Lifestyle  . Physical activity:    Days per week: Not on file    Minutes per session: Not on file  . Stress: Not on file  Relationships  . Social connections:    Talks on phone: Not on file    Gets together: Not on file    Attends religious service: Not on file    Active member of club or organization: Not on file    Attends meetings of clubs or organizations: Not on file    Relationship status: Not on file  Other Topics  Concern  . Not on file  Social History Narrative  . Not on file   History reviewed. No pertinent family history. No Known Allergies Prior to Admission medications   Medication Sig Start Date End Date Taking? Authorizing Provider  methocarbamol (ROBAXIN) 500 MG tablet Take 500 mg by mouth daily.  02/24/19  Yes [provider]  traMADol (ULTRAM) 50 MG tablet Take 50 mg by mouth daily.  02/24/19  Yes [provider]     All other systems have been reviewed and were otherwise negative with the exception of those mentioned in the HPI and as above.  Physical Exam: Vitals:   03/10/19 0549  BP: 137/85  Pulse: 70  Resp: 18  Temp: 98.8 F (37.1 C)  SpO2: 98%    Body mass index is 24.64 kg/m.  General: Alert, no acute distress Cardiovascular: No pedal edema Respiratory: No cyanosis, no use of accessory musculature Skin: No lesions in the area of chief complaint Neurologic: Sensation intact distally Psychiatric: Patient is competent for consent with normal mood and affect Lymphatic: No axillary or cervical lymphadenopathy  MUSCULOSKELETAL: 4/5 strength to DF on the right  Assessment/Plan: LUMBAR 5 RADICULOPATHY SECOMDARY TO A GRADE 3 SPONDYLOLISTHESIS  WITH SEVERE BILATERAL NEUROFORAMINAL STENOSIS AT LUMBAR 5 - SACRUM1, RIGHT LEG PAIN AND WEAKNESS Plan for Procedure(s): LUMBAR 4-5, LUMBAR 5 - SACRUM 1 ANTERIOR LUMBAR INTERBODY FUSION WITH INSTRUMENTATION AND ALLOGRAFT (STAGE 1 OF 2)  *Of note, the patient's pain has been debilitating, and his weakness has been significant. He has been having substantial difficulty performing routine activities of daily living. I do feel that delaying his surgery for more than an additional month would result in substantial undue stress and potentially progressive neurologic deficit, which could be permanent. For these reasons, the patient and I did together elect to proceed with surgery on an urgent basis.   [name], MD  03/10/2019 7:11 AM

## 2019-03-10 NOTE — Progress Notes (Signed)
Orthopedic Tech Progress Note Patient Details:  Ricky Norris 1986/06/06 [id number] Called in order to [facility]. Patient ID: [name], male   DOB: [birth year], 34 y.o.   MRN: [id number]   Daphane Shepherd 03/10/2019, 3:06 PM

## 2019-03-10 NOTE — Transfer of Care (Signed)
Immediate Anesthesia Transfer of Care Note  Patient: Ricky Norris  Procedure(s) Performed: LUMBAR FOUR TO FIVE, LUMBAR FIVE TO SACRUM ONE ANTERIOR LUMBAR INTERBODY FUSION WITH INSTRUMENTATION AND ALLOGRAFT (N/A Spine Lumbar) ABDOMINAL EXPOSURE (N/A Abdomen)  Patient Location: PACU  Anesthesia Type:General  Level of Consciousness: awake, alert  and oriented  Airway & Oxygen Therapy: Patient Spontanous Breathing  Post-op Assessment: Report given to RN and Post -op Vital signs reviewed and stable  Post vital signs: Reviewed and stable  Last Vitals:  Vitals Value Taken Time  BP 127/75 03/10/2019 12:37 PM  Temp    Pulse 93 03/10/2019 12:41 PM  Resp 14 03/10/2019 12:41 PM  SpO2 100 % 03/10/2019 12:41 PM  Vitals shown include unvalidated device data.  Last Pain:  Vitals:   03/10/19 0605  TempSrc:   PainSc: 7       Patients Stated Pain Goal: 3 (03/10/19 4193)  Complications: No apparent anesthesia complications

## 2019-03-10 NOTE — Anesthesia Procedure Notes (Signed)
Arterial Line Insertion Start/End4/22/2020 7:15 AM, 03/10/2019 7:23 AM Performed by: Rachel Moulds, CRNA, CRNA  Patient location: Pre-op. Preanesthetic checklist: patient identified, IV checked, site marked, risks and benefits discussed, surgical consent, monitors and equipment checked, pre-op evaluation, timeout performed and anesthesia consent radial was placed Catheter size: 20 G Allen's test indicative of satisfactory collateral circulation Attempts: 1 Procedure performed without using ultrasound guided technique. Following insertion, dressing applied and Biopatch. Post procedure assessment: normal  Patient tolerated the procedure well with no immediate complications.

## 2019-03-10 NOTE — Anesthesia Preprocedure Evaluation (Addendum)
Anesthesia Evaluation  Patient identified by MRN, date of birth, ID band Patient awake    Reviewed: Allergy & Precautions, NPO status , Patient's Chart, lab work & pertinent test results  Airway Mallampati: II  TM Distance: >3 FB     Dental   Pulmonary neg pulmonary ROS,    breath sounds clear to auscultation       Cardiovascular Exercise Tolerance: Good negative cardio ROS   Rhythm:Regular Rate:Normal     Neuro/Psych R leg ridiculopathy  Neuromuscular disease    GI/Hepatic negative GI ROS, Neg liver ROS,   Endo/Other  negative endocrine ROS  Renal/GU negative Renal ROS     Musculoskeletal   Abdominal   Peds  Hematology   Anesthesia Other Findings   Reproductive/Obstetrics                            Lab Results  Component Value Date   WBC 8.0 03/09/2019   HGB 15.6 03/09/2019   HCT 44.7 03/09/2019   MCV 92.5 03/09/2019   PLT 353 03/09/2019   Lab Results  Component Value Date   CREATININE 0.98 03/09/2019   BUN 7 03/09/2019   NA 137 03/09/2019   K 3.8 03/09/2019   CL 102 03/09/2019   CO2 25 03/09/2019    Anesthesia Physical  Anesthesia Plan  ASA: II  Anesthesia Plan: General   Post-op Pain Management:    Induction: Intravenous  PONV Risk Score and Plan: 2 and Treatment may vary due to age or medical condition, Ondansetron, Dexamethasone and Midazolam  Airway Management Planned: Oral ETT  Additional Equipment:   Intra-op Plan:   Post-operative Plan: Extubation in OR  Informed Consent: I have reviewed the patients History and Physical, chart, labs and discussed the procedure including the risks, benefits and alternatives for the proposed anesthesia with the patient or authorized representative who has indicated his/her understanding and acceptance.     Dental advisory given  Plan Discussed with: CRNA  Anesthesia Plan Comments:        Anesthesia Quick  Evaluation

## 2019-03-10 NOTE — Op Note (Signed)
PATIENT NAME: Cammy CopaKevin Salone   MEDICAL RECORD NO.:   [id number]    PHYSICIAN:  Estill BambergMark Obrian Bulson, MD      DATE OF BIRTH: 01/24/1986  ASSISTANT: Jason CoopKAYLA MCKENZIE, PA-C                        DATE OF PROCEDURE:  03/10/2019                              OPERATIVE REPORT   PREOPERATIVE DIAGNOSES: 1. Right greater than left L5 radiculopathy, manifesting as progressive leg pain and weakness 2. Severe bilateral neuroforaminal stenosis, L5/S1 3. Bilateral pars defect, L5 4. Grade 3 spondylolisthesis with a high sacral inclination  POSTOPERATIVE DIAGNOSES: 1. Right greater than left L5 radiculopathy, manifesting as progressive leg pain and weakness 2. Severe bilateral neuroforaminal stenosis, L5/S1 3. Bilateral pars defect, L5 4. Grade 3 spondylolisthesis with a high sacral inclinationspondylolisthesis  PROCEDURE (STAGE 1 OF 2): 1. Anterior lumbar interbody fusion, L4/5 L5/S1 (expsure peformed by Dr. Tawanna Coolerodd Early) 2. Insertion of interbody device x 2 (Syncage Evolution spacers: 17mm small, lordotic at L5/S1, 12mm medium lordotic at L4/5) 3. Placement of anterior instrumentation securing the L4/5 and L5/S1 level. 4. Intraoperative use of fluoroscopy. 5. Use of morselized allograft - DBX-mix  SURGEON:  Estill BambergMark Electra Paladino, MD  ASSISTANT:  Jason CoopKayla McKenzie, PA-C  ANESTHESIA:  General endotracheal anesthesia.  COMPLICATIONS:  None.  DISPOSITION:  Stable.  ESTIMATED BLOOD LOSS:  100cc  INDICATIONS FOR SURGERY:  Briefly, [name] is a very pleasant 33 year old male, who has been having progressive debilitating pain and weakness in the right leg. The patient's MRI and xrays were as noted above. The patient did fail appropriate nonoperative measures, but did continue to have significant pain and weakness in the right leg. Given his ongoing pain, weakness and dysfunction, we did discuss proceeding with the procedure noted above.  The patient was fully aware of the risks and limitations associated with  surgery, and did wish to proceed.  The plan was to proceed with stage 1 of his procedure today, and to return tomorrow for stage 2, specifically, a posterior fusion and instrumentation procedure.   OPERATIVE DETAILS:  On 03/10/2019, the patient was brought to surgery and general endotracheal anesthesia was administered.  The patient was placed supine on the hospital bed.  The patient's abdomen was prepped and draped in the usual sterile fashion.  An anterior retroperitoneal approach was then performed by Dr. Gretta Beganodd Early.  Once the anterior lumbar spine was noted, we did focus our attention on the L5-S1 intervertebral space.  I then performed a thorough and complete L5-S1 intervertebral diskectomy to the level of the posterior longitudinal ligament. There was noted to be a high grade slip, with significant translation of L5 on S1. I was able to meticulously performed a discectomy, and was able to distract the intervertebral space, which did result in partial reduction of the spondylolisthesis. The endplates were then prepared and the appropriate sized anterior intervertebral spacer was packed with DBX-mix and tamped into position. I was very pleased with the press-fit of the implant.  I then proceeded with placement of anterior instrumentation at the L5/S1 intervertebral space.  To accomplish this, I did use an awl to prepare the trajectory of the anterior vertebral body screw.  An anterior fixation device was attached to the back end of the screw and did provide anterior fixation across the L5/S1 intervertebral  space, securing the intervertebral implant into place.  I was very pleased with the press-fit of the anterior hardware.   At this point, Dr. Arbie Cookey reentered the room and exposed the L4/5 level. I then proceeded with a discectomy to the PLL, after which point the endplates were prepared and the appropriate sized anterior intervertebral spacer was packed with DBX-mix and tamped into  position. I was very pleased with the press-fit of the implant.  I then proceeded with placement of anterior instrumentation at the L4/5 intervertebral space.  To accomplish this, I did use an awl to prepare the trajectory of the anterior vertebral body screw into L5.  An anterior fixation device was attached to the back end of the screw and did provide anterior fixation across the L4/5 intervertebral space, securing the intervertebral implant into place.  I was very pleased with the press-fit of the anterior hardware.   I did liberally use AP and lateral fluoroscopy to ensure that the implant and anterior fixation was in the appropriate position, and was very pleased with the radiographs. The wound was copiously irrigated.  The fascia was  closed using #1 PDS.  The subcutaneous layer was closed using 0 Vicryl followed by 2-0 Vicryl, and the skin was closed using 4-0 Monocryl. Benzoin and Steri-Strips were applied followed by sterile dressing.   All instrument counts were correct at the termination of the procedure. I did use neurologic monitoring throughout the surgery, and there was no abnormal EMG activity noted throughout the surgery.  Of note, Jason Coop was my assistant throughout surgery, and did aid in retraction, suctioning, and closure for both the entire procedure.   Estill Bamberg, MD

## 2019-03-10 NOTE — OR Nursing (Signed)
Pulse oximeter alarmed at @ 10:43.  Pulse in distal left big toe not detected. Pulse ox undetectable.     Device turned off at 10:52 due to continued alarming.  MD aware of lack of pulse and pulse ox

## 2019-03-11 ENCOUNTER — Inpatient Hospital Stay (HOSPITAL_COMMUNITY): Payer: Commercial Managed Care - PPO | Admitting: Certified Registered Nurse Anesthetist

## 2019-03-11 ENCOUNTER — Inpatient Hospital Stay (HOSPITAL_COMMUNITY): Payer: Commercial Managed Care - PPO

## 2019-03-11 ENCOUNTER — Inpatient Hospital Stay (HOSPITAL_COMMUNITY)
Admission: RE | Disposition: A | Discharge status: Home / Self Care | Payer: Self-pay | Source: Home / Self Care | Attending: Orthopedic Surgery

## 2019-03-11 ENCOUNTER — Encounter (HOSPITAL_COMMUNITY): Payer: Self-pay | Admitting: Orthopedic Surgery

## 2019-03-11 ENCOUNTER — Inpatient Hospital Stay (HOSPITAL_COMMUNITY)
Admission: RE | Admit: 2019-03-11 | Payer: Commercial Managed Care - PPO | Source: Home / Self Care | Admitting: Orthopedic Surgery

## 2019-03-11 SURGERY — POSTERIOR LUMBAR FUSION 2 LEVEL
Anesthesia: General

## 2019-03-11 MED ORDER — SODIUM CHLORIDE 0.9 % IV SOLN
INTRAVENOUS | Status: DC | PRN
  Administered 2019-03-11: 12:00:00 via INTRAVENOUS
  Administered 2019-03-11: 08:00:00 25 ug/min via INTRAVENOUS

## 2019-03-11 MED ORDER — BUPIVACAINE LIPOSOME 1.3 % IJ SUSP
20.0000 mL | INTRAMUSCULAR | Status: AC
  Administered 2019-03-11: 20 mL
  Filled 2019-03-11: qty 20

## 2019-03-11 MED ORDER — CEFAZOLIN SODIUM-DEXTROSE 2-3 GM-%(50ML) IV SOLR
INTRAVENOUS | Status: DC | PRN
  Administered 2019-03-11 (×2): 2 g via INTRAVENOUS

## 2019-03-11 MED ORDER — SODIUM CHLORIDE 0.9 % IV SOLN: INTRAVENOUS | Status: DC | PRN

## 2019-03-11 MED ORDER — ONDANSETRON HCL 4 MG/2ML IJ SOLN
INTRAMUSCULAR | Status: DC | PRN
  Administered 2019-03-11 (×2): 4 mg via INTRAVENOUS

## 2019-03-11 MED ORDER — DIAZEPAM 5 MG PO TABS
ORAL_TABLET | ORAL | Status: AC
  Filled 2019-03-11: qty 1

## 2019-03-11 MED ORDER — DEXAMETHASONE SODIUM PHOSPHATE 10 MG/ML IJ SOLN
INTRAMUSCULAR | Status: DC | PRN
  Administered 2019-03-11: 10 mg via INTRAVENOUS

## 2019-03-11 MED ORDER — THROMBIN 20000 UNITS EX SOLR
CUTANEOUS | Status: DC | PRN
  Administered 2019-03-11: 20000 [IU] via TOPICAL

## 2019-03-11 MED ORDER — HYDROXYZINE HCL 50 MG/ML IM SOLN
50.0000 mg | Freq: Four times a day (QID) | INTRAMUSCULAR | Status: DC | PRN
  Administered 2019-03-11: 50 mg via INTRAMUSCULAR
  Filled 2019-03-11 (×3): qty 1

## 2019-03-11 MED ORDER — SODIUM CHLORIDE 0.9 % IV SOLN
1.0000 mg/kg/h | INTRAVENOUS | Status: DC
  Filled 2019-03-11: qty 5

## 2019-03-11 MED ORDER — BUPIVACAINE-EPINEPHRINE 0.25% -1:200000 IJ SOLN
INTRAMUSCULAR | Status: DC | PRN
  Administered 2019-03-11: 10 mL
  Administered 2019-03-11: 20 mL

## 2019-03-11 MED ORDER — BUPIVACAINE-EPINEPHRINE (PF) 0.25% -1:200000 IJ SOLN
INTRAMUSCULAR | Status: AC
  Filled 2019-03-11: qty 30

## 2019-03-11 MED ORDER — PHENYLEPHRINE 40 MCG/ML (10ML) SYRINGE FOR IV PUSH (FOR BLOOD PRESSURE SUPPORT)
PREFILLED_SYRINGE | INTRAVENOUS | Status: DC | PRN
  Administered 2019-03-11: 40 ug via INTRAVENOUS
  Administered 2019-03-11: 80 ug via INTRAVENOUS

## 2019-03-11 MED ORDER — LIDOCAINE 2% (20 MG/ML) 5 ML SYRINGE
INTRAMUSCULAR | Status: DC | PRN
  Administered 2019-03-11: 80 mg via INTRAVENOUS

## 2019-03-11 MED ORDER — OXYCODONE-ACETAMINOPHEN 5-325 MG PO TABS
ORAL_TABLET | ORAL | Status: AC
  Filled 2019-03-11: qty 2

## 2019-03-11 MED ORDER — PROMETHAZINE HCL 25 MG/ML IJ SOLN: 6.2500 mg | INTRAMUSCULAR | Status: DC | PRN

## 2019-03-11 MED ORDER — SODIUM CHLORIDE 0.9 % IV SOLN
INTRAVENOUS | Status: DC | PRN
  Administered 2019-03-11: 15 ug/kg/min via INTRAVENOUS

## 2019-03-11 MED ORDER — SUCCINYLCHOLINE CHLORIDE 200 MG/10ML IV SOSY
PREFILLED_SYRINGE | INTRAVENOUS | Status: DC | PRN
  Administered 2019-03-11: 120 mg via INTRAVENOUS

## 2019-03-11 MED ORDER — SODIUM CHLORIDE 0.9 % IV SOLN
INTRAVENOUS | Status: AC | PRN
  Administered 2019-03-11: .4 mg via INTRAVENOUS

## 2019-03-11 MED ORDER — PROPOFOL 500 MG/50ML IV EMUL
INTRAVENOUS | Status: DC | PRN
  Administered 2019-03-11: 40 ug/kg/min via INTRAVENOUS
  Administered 2019-03-11: 30 ug/kg/min via INTRAVENOUS

## 2019-03-11 MED ORDER — PROPOFOL 10 MG/ML IV BOLUS
INTRAVENOUS | Status: DC | PRN
  Administered 2019-03-11: 30 mg via INTRAVENOUS
  Administered 2019-03-11: 170 mg via INTRAVENOUS

## 2019-03-11 MED ORDER — LACTATED RINGERS IV SOLN
INTRAVENOUS | Status: DC | PRN
  Administered 2019-03-11 (×2): via INTRAVENOUS

## 2019-03-11 MED ORDER — ARTIFICIAL TEARS OPHTHALMIC OINT
TOPICAL_OINTMENT | OPHTHALMIC | Status: DC | PRN
  Administered 2019-03-11: 1 via OPHTHALMIC

## 2019-03-11 MED ORDER — MIDAZOLAM HCL 2 MG/2ML IJ SOLN
INTRAMUSCULAR | Status: DC | PRN
  Administered 2019-03-11: 2 mg via INTRAVENOUS

## 2019-03-11 MED ORDER — SCOPOLAMINE 1 MG/3DAYS TD PT72
MEDICATED_PATCH | TRANSDERMAL | Status: DC | PRN
  Administered 2019-03-11: 1 via TRANSDERMAL

## 2019-03-11 MED ORDER — EPHEDRINE SULFATE 50 MG/ML IJ SOLN
INTRAMUSCULAR | Status: DC | PRN
  Administered 2019-03-11: 5 mg via INTRAVENOUS

## 2019-03-11 MED ORDER — FENTANYL CITRATE (PF) 250 MCG/5ML IJ SOLN
INTRAMUSCULAR | Status: DC | PRN
  Administered 2019-03-11: 100 ug via INTRAVENOUS
  Administered 2019-03-11: 50 ug via INTRAVENOUS

## 2019-03-11 MED ORDER — HYDROMORPHONE HCL 1 MG/ML IJ SOLN: 0.2500 mg | INTRAMUSCULAR | Status: DC | PRN

## 2019-03-11 MED ORDER — LACTATED RINGERS IV SOLN
INTRAVENOUS | Status: DC | PRN
  Administered 2019-03-11: 09:00:00 via INTRAVENOUS

## 2019-03-11 SURGICAL SUPPLY — 96 items
BENZOIN TINCTURE PRP APPL 2/3 (GAUZE/BANDAGES/DRESSINGS) ×2 IMPLANT
BLADE CLIPPER SURG (BLADE) IMPLANT
BUR PRECISION FLUTE 5.0 (BURR) ×2 IMPLANT
BUR PRESCISION 1.7 ELITE (BURR) IMPLANT
BUR ROUND PRECISION 4.0 (BURR) IMPLANT
BUR SABER RD CUTTING 3.0 (BURR) IMPLANT
CARTRIDGE OIL MAESTRO DRILL (MISCELLANEOUS) ×2 IMPLANT
CLSR STERI-STRIP ANTIMIC 1/2X4 (GAUZE/BANDAGES/DRESSINGS) ×2 IMPLANT
CONNECTOR Z ROD UNIVERSAL 5.5 (Connector) ×4 IMPLANT
CONT SPEC 4OZ CLIKSEAL STRL BL (MISCELLANEOUS) ×2 IMPLANT
COVER SURGICAL LIGHT HANDLE (MISCELLANEOUS) ×2 IMPLANT
COVER WAND RF STERILE (DRAPES) ×2 IMPLANT
DIFFUSER DRILL AIR PNEUMATIC (MISCELLANEOUS) ×4 IMPLANT
DRAIN CHANNEL 15F RND FF W/TCR (WOUND CARE) ×2 IMPLANT
DRAPE C-ARM 42X72 X-RAY (DRAPES) ×2 IMPLANT
DRAPE C-ARMOR (DRAPES) ×2 IMPLANT
DRAPE POUCH INSTRU U-SHP 10X18 (DRAPES) ×2 IMPLANT
DRAPE SURG 17X23 STRL (DRAPES) ×8 IMPLANT
DRSG MEPILEX BORDER 4X12 (GAUZE/BANDAGES/DRESSINGS) IMPLANT
DRSG MEPILEX BORDER 4X8 (GAUZE/BANDAGES/DRESSINGS) IMPLANT
DURAPREP 26ML APPLICATOR (WOUND CARE) ×2 IMPLANT
ELECT BLADE 4.0 EZ CLEAN MEGAD (MISCELLANEOUS) ×2
ELECT CAUTERY BLADE 6.4 (BLADE) ×4 IMPLANT
ELECT REM PT RETURN 9FT ADLT (ELECTROSURGICAL) ×2
ELECTRODE BLDE 4.0 EZ CLN MEGD (MISCELLANEOUS) ×1 IMPLANT
ELECTRODE REM PT RTRN 9FT ADLT (ELECTROSURGICAL) ×1 IMPLANT
EVACUATOR SILICONE 100CC (DRAIN) ×2 IMPLANT
FEE INTRAOP MONITOR IMPULS NCS (MISCELLANEOUS) ×1 IMPLANT
GAUZE 4X4 16PLY RFD (DISPOSABLE) ×4 IMPLANT
GAUZE SPONGE 4X4 12PLY STRL (GAUZE/BANDAGES/DRESSINGS) ×2 IMPLANT
GLOVE BIO SURGEON STRL SZ7 (GLOVE) ×2 IMPLANT
GLOVE BIO SURGEON STRL SZ8 (GLOVE) ×2 IMPLANT
GLOVE BIOGEL PI IND STRL 7.0 (GLOVE) ×1 IMPLANT
GLOVE BIOGEL PI IND STRL 8 (GLOVE) ×1 IMPLANT
GLOVE BIOGEL PI INDICATOR 7.0 (GLOVE) ×1
GLOVE BIOGEL PI INDICATOR 8 (GLOVE) ×1
GOWN STRL REUS W/ TWL LRG LVL3 (GOWN DISPOSABLE) ×2 IMPLANT
GOWN STRL REUS W/ TWL XL LVL3 (GOWN DISPOSABLE) ×1 IMPLANT
GOWN STRL REUS W/TWL LRG LVL3 (GOWN DISPOSABLE) ×2
GOWN STRL REUS W/TWL XL LVL3 (GOWN DISPOSABLE) ×1
GUIDEWIRE BLUNT VIPER II 1.45 (WIRE) ×6 IMPLANT
GUIDEWIRE SHARP VIPER II (WIRE) ×20 IMPLANT
INTRAOP MONITOR FEE IMPULS NCS (MISCELLANEOUS) ×1
INTRAOP MONITOR FEE IMPULSE (MISCELLANEOUS) ×1
IV CATH 14GX2 1/4 (CATHETERS) ×2 IMPLANT
KIT ALARA NEURO ACCESS (KITS) ×6 IMPLANT
KIT BASIN OR (CUSTOM PROCEDURE TRAY) ×2 IMPLANT
KIT POSITION SURG JACKSON T1 (MISCELLANEOUS) ×2 IMPLANT
KIT TURNOVER KIT B (KITS) ×2 IMPLANT
MARKER SKIN DUAL TIP RULER LAB (MISCELLANEOUS) ×2 IMPLANT
NEEDLE HYPO 25GX1X1/2 BEV (NEEDLE) ×2 IMPLANT
NEEDLE SPNL 18GX3.5 QUINCKE PK (NEEDLE) ×4 IMPLANT
NS IRRIG 1000ML POUR BTL (IV SOLUTION) ×2 IMPLANT
OIL CARTRIDGE MAESTRO DRILL (MISCELLANEOUS) ×4
PACK LAMINECTOMY ORTHO (CUSTOM PROCEDURE TRAY) ×2 IMPLANT
PACK UNIVERSAL I (CUSTOM PROCEDURE TRAY) ×2 IMPLANT
PAD ARMBOARD 7.5X6 YLW CONV (MISCELLANEOUS) ×4 IMPLANT
PATTIES SURGICAL .5 X1 (DISPOSABLE) ×2 IMPLANT
PATTIES SURGICAL .5 X3 (DISPOSABLE) IMPLANT
PATTIES SURGICAL .5X1.5 (GAUZE/BANDAGES/DRESSINGS) ×2 IMPLANT
PATTIES SURGICAL .75X.75 (GAUZE/BANDAGES/DRESSINGS) ×2 IMPLANT
PROBE PEDCLE PROBE MAGSTM DISP (MISCELLANEOUS) ×2 IMPLANT
PUTTY BONE DBX 2.5 MIS (Bone Implant) ×2 IMPLANT
ROD VIPER LORD 5.5X110MM (Rod) ×4 IMPLANT
SCREW POLY VIPER2 7X40MM (Screw) ×4 IMPLANT
SCREW SET SINGLE INNER MIS (Screw) ×16 IMPLANT
SCREW VIPER 8X80 TI CORTI T27 (Screw) ×4 IMPLANT
SCREW XTAB 6X50 (Screw) ×4 IMPLANT
SCREW XTAB POLY VIPER  7X45 (Screw) ×2 IMPLANT
SCREW XTAB POLY VIPER 7X45 (Screw) ×2 IMPLANT
SPONGE INTESTINAL PEANUT (DISPOSABLE) IMPLANT
SPONGE SURGIFOAM ABS GEL 100 (HEMOSTASIS) IMPLANT
STRIP CLOSURE SKIN 1/2X4 (GAUZE/BANDAGES/DRESSINGS) IMPLANT
STYLET INTUB SATIN SLIP 14FR (MISCELLANEOUS) ×2 IMPLANT
SURGIFLO W/THROMBIN 8M KIT (HEMOSTASIS) IMPLANT
SUT MNCRL AB 4-0 PS2 18 (SUTURE) ×2 IMPLANT
SUT VIC AB 0 CT1 18XCR BRD 8 (SUTURE) ×2 IMPLANT
SUT VIC AB 0 CT1 8-18 (SUTURE) ×2
SUT VIC AB 1 CT1 18XCR BRD 8 (SUTURE) ×2 IMPLANT
SUT VIC AB 1 CT1 8-18 (SUTURE) ×2
SUT VIC AB 2-0 CT2 18 VCP726D (SUTURE) ×4 IMPLANT
SYR 20CC LL (SYRINGE) ×2 IMPLANT
SYR BULB IRRIGATION 50ML (SYRINGE) ×2 IMPLANT
SYR CONTROL 10ML LL (SYRINGE) ×4 IMPLANT
SYR TB 1ML LUER SLIP (SYRINGE) ×2 IMPLANT
TAP CANN VIPER2 DL 5.0 (TAP) ×4 IMPLANT
TAP CANN VIPER2 DL 6.0 (TAP) ×4 IMPLANT
TAP CANN VIPER2 DL 7.0 (TAP) ×4 IMPLANT
TAP VIPER CANN EXT 8.0 (TAP) ×2 IMPLANT
TAP VIPER CANN EXT 9.0 (TAP) ×4 IMPLANT
TAPE CLOTH SURG 6X10 WHT LF (GAUZE/BANDAGES/DRESSINGS) ×2 IMPLANT
TOWEL OR 17X24 6PK STRL BLUE (TOWEL DISPOSABLE) ×2 IMPLANT
TOWEL OR 17X26 10 PK STRL BLUE (TOWEL DISPOSABLE) ×2 IMPLANT
TRAY FOLEY MTR SLVR 16FR STAT (SET/KITS/TRAYS/PACK) ×2 IMPLANT
WATER STERILE IRR 1000ML POUR (IV SOLUTION) ×2 IMPLANT
YANKAUER SUCT BULB TIP NO VENT (SUCTIONS) ×2 IMPLANT

## 2019-03-11 NOTE — Anesthesia Procedure Notes (Signed)
Procedure Name: Intubation Date/Time: 03/11/2019 7:55 AM Performed by: Bryson Corona, CRNA Pre-anesthesia Checklist: Patient identified, Emergency Drugs available, Suction available and Patient being monitored Patient Re-evaluated:Patient Re-evaluated prior to induction Oxygen Delivery Method: Circle System Utilized Preoxygenation: Pre-oxygenation with 100% oxygen Induction Type: IV induction and Rapid sequence Laryngoscope Size: Mac and 4 Grade View: Grade I Tube type: Oral Tube size: 7.0 mm Number of attempts: 1 Airway Equipment and Method: Stylet and Oral airway Placement Confirmation: ETT inserted through vocal cords under direct vision,  positive ETCO2 and breath sounds checked- equal and bilateral Secured at: 23 cm Tube secured with: Tape Dental Injury: Teeth and Oropharynx as per pre-operative assessment

## 2019-03-11 NOTE — Transfer of Care (Signed)
Immediate Anesthesia Transfer of Care Note  Patient: Ricky Norris  Procedure(s) Performed: LUMBAR 4- SACRUM 1 WITH PELVIC FIXATION POSTERIOR SPINAL FUSION WITH NSTRUMENTATION AND ALLOGRAFT (N/A )  Patient Location: PACU  Anesthesia Type:General  Level of Consciousness: drowsy  Airway & Oxygen Therapy: Patient Spontanous Breathing and Patient connected to face mask oxygen  Post-op Assessment: Report given to RN and Post -op Vital signs reviewed and stable  Post vital signs: Reviewed and stable  Last Vitals:  Vitals Value Taken Time  BP 141/88 03/11/2019  1:35 PM  Temp    Pulse 113 03/11/2019  1:36 PM  Resp 21 03/11/2019  1:36 PM  SpO2 100 % 03/11/2019  1:36 PM  Vitals shown include unvalidated device data.  Last Pain:  Vitals:   03/11/19 0426  TempSrc: Oral  PainSc:       Patients Stated Pain Goal: 3 (03/10/19 4580)  Complications: No apparent anesthesia complications

## 2019-03-11 NOTE — H&P (Signed)
Patient reports complete resolution of his preoperative bilateral leg pain.  He does have some abdominal discomfort as expected, and nausea.  We will proceed with stage 2 of his surgery today, specifically, a posterior fusion with instrumentation, extended to the pelvis.

## 2019-03-11 NOTE — Progress Notes (Addendum)
    Patient in pre-op holding planned posterior lumbar fusion today. Abdomin non distended, dressing clean and dry. B LE N/V/m intact  S/P Anterior lumbar exposure Disposition stable for second stage lumbar fusion.  Mosetta Pigeon PA-C  I have independently interviewed and examined patient and agree with PA assessment and plan above. No left leg swelling, palpable pedal pulses. To OR today with Dr. Yevette Edwards. Vascular will be available as needed.    Mikal Blasdell C. Randie Heinz, MD [facility] Office: [phone] Pager: [phone]

## 2019-03-11 NOTE — Anesthesia Postprocedure Evaluation (Signed)
Anesthesia Post Note  Patient: Ricky Norris  Procedure(s) Performed: LUMBAR 4- SACRUM 1 WITH PELVIC FIXATION POSTERIOR SPINAL FUSION WITH NSTRUMENTATION AND ALLOGRAFT (N/A )     Patient location during evaluation: PACU Anesthesia Type: General Level of consciousness: awake and alert Pain management: pain level controlled Vital Signs Assessment: post-procedure vital signs reviewed and stable Respiratory status: spontaneous breathing, nonlabored ventilation, respiratory function stable and patient connected to nasal cannula oxygen Cardiovascular status: blood pressure returned to baseline and stable Postop Assessment: no apparent nausea or vomiting Anesthetic complications: no    Last Vitals:  Vitals:   03/11/19 1400 03/11/19 1415  BP: 107/73 111/65  Pulse: 91 89  Resp: 19   Temp:  36.4 C  SpO2: 100% 100%    Last Pain:  Vitals:   03/11/19 1415  TempSrc: Oral  PainSc:                  Kennieth Rad

## 2019-03-11 NOTE — Op Note (Signed)
PATIENT NAME: Ricky Norris   MEDICAL RECORD NO.:  [id number]   PHYSICIAN: Estill BambergMark Amily Depp, MD DATE OF BIRTH: 06/28/1986  ASSISTANT: Jason CoopKAYLA MCKENZIE, PA-C  DATE OF PROCEDURE: 03/11/2019   OPERATIVE REPORT   PREOPERATIVE DIAGNOSES: S/p anterior lumbar interbody fusion at L4-5 and L5-S1, requiring posterior instrumentation and fusion.  See operative report dated 03/10/2019 for additional details with regards to the need for surgery.    POSTOPERATIVE DIAGNOSES: S/p anterior lumbar interbody fusion at L4-5 and L5-S1, requiring posterior instrumentation and fusion.  See operative report dated 03/10/2019 for additional details with regards to the need for surgery.    PROCEDURE (STAGE 2 OF 2):   1. Posterior spinal fusion L4-5, L5-S1 2. Placement of posterior segmental fixation L4, L5, S1, bilaterally 3. Placement of posterior pelvic fixation bilaterally (SAI screws, 8 x 80mm) 4. Intraoperative use of fluoroscopy 5. Use of allograft-DBX mix   SURGEON:  Estill BambergMark Moxie Kalil, MD   ASSISTANT:  Jason CoopKayla McKenzie PA-C.   ANESTHESIA:  General endotracheal anesthesia.   COMPLICATIONS:  None.   DISPOSITION:  Stable.   ESTIMATED BLOOD LOSS:  Minimal.   INDICATIONS:  Briefly, Ricky Norris is a very pleasant 33 year-old  male, who is status post an anterior lumbar fusion as noted in my operative note dated 03/10/2019.  He did present today for a posterior fusion procedure, with posterior instrumentation to the pelvis, with instrumentation and allograft.   DESCRIPTION OF PROCEDURE:  On 03/11/2019 the patient was brought to surgery and general endotracheal anesthesia was administered.  The patient was placed prone on a well-padded Jackson spinal frame.  The back was prepped and draped in the usual sterile fashion, and a timeout procedure was performed.  At this point, the pedicles of L4, L5, and S1 were marked using AP fluoroscopy.  Paramedian incisions  were made bilaterally, just lateral to the lateral aspect of the pedicles.  At this point, Jamshidi needles were advanced across the pedicles bilaterally at L4, L5, and S1.  I then exposed the posterior lateral gutters on the left side from L4-S1.  The transverse processes and posterior lateral gutter across L4/5 and L5/S1 were decorticated using a high-speed bur, after which point, DBX-mix was packed into the posterior lateral gutters to help aid in the success of the posterior fusion.  At this point, I did tap over the guidewires using a 6 mm tap.  I did use triggered EMG to ensure that the tap was not in the immediate vicinity of any neurologic structures.  At this point, pedicle screws of the appropriate diameter and length were advanced over the guidewires into the L4, L5, and S1 pedicles bilaterally.  The guidewires were then removed.  At this point, a midline incision was made over the region of the sacrum.  Liberally using AP fluoroscopy, as well as iliac and obturator oblique imaging, Jamshidi's were advanced through the sacrum and into the ileum.  Guidewires were advanced through the needles, after which point a cannulated awl was utilized.  Again, I did use iliac oblique imaging to ensure that the awl was within 2 cm of the sciatic notch bilaterally.  Obturator oblique imaging did confirm that the awl was within the ileum bilaterally.  8 x 80 mm screws were then advanced.  Excellent purchase was noted.  At this point, the midline incision was irrigated and closed using 2-0 Vicryl followed by 4-0 Monocryl.  I then turned my attention to the paramedian incisions.  At this point, rods were secured into the  tulip heads of the screws bilaterally, from L4 down to the SAI (pelvic) screws.  Caps were placed in a final locking procedure was performed bilaterally.  I was very pleased with the final AP and fluoroscopic images.  At this point, the bilateral paramedian incisions were copiously irrigated.  The wounds  were then closed using #1 Vicryl, followed by 2-0 Vicryl, followed by 4-0 Monocryl.   Of note, I  did use neurologic monitoring throughout the entire surgery, and there was no sustained EMG activity noted throughout the entire surgery. All instrument counts were correct at the termination of the procedure.   Of note, Jason Coop was my assistant throughout surgery, and did aid in retraction, suctioning, placement of instrumentation, and closure throughout the surgery.

## 2019-03-11 NOTE — Anesthesia Postprocedure Evaluation (Signed)
Anesthesia Post Note  Patient: Ricky Norris  Procedure(s) Performed: LUMBAR 4- SACRUM 1 WITH PELVIC FIXATION POSTERIOR SPINAL FUSION WITH NSTRUMENTATION AND ALLOGRAFT (N/A )     Patient location during evaluation: PACU Anesthesia Type: General Level of consciousness: awake and alert Pain management: pain level controlled Vital Signs Assessment: post-procedure vital signs reviewed and stable Respiratory status: spontaneous breathing, nonlabored ventilation, respiratory function stable and patient connected to nasal cannula oxygen Cardiovascular status: blood pressure returned to baseline and stable Postop Assessment: no apparent nausea or vomiting Anesthetic complications: no    Last Vitals:  Vitals:   03/11/19 1400 03/11/19 1415  BP: 107/73 111/65  Pulse: 91 89  Resp: 19   Temp:  36.4 C  SpO2: 100% 100%    Last Pain:  Vitals:   03/11/19 1415  TempSrc: Oral  PainSc:                  [name] E     

## 2019-03-12 MED ORDER — DIAZEPAM 5 MG PO TABS: 5.0000 mg | ORAL_TABLET | Freq: Four times a day (QID) | ORAL | 0 refills | Status: AC | PRN

## 2019-03-12 MED ORDER — OXYCODONE-ACETAMINOPHEN 5-325 MG PO TABS: 1.0000 | ORAL_TABLET | ORAL | 0 refills | Status: AC | PRN

## 2019-03-12 MED ORDER — ONDANSETRON HCL 4 MG PO TABS: 4.0000 mg | ORAL_TABLET | Freq: Four times a day (QID) | ORAL | 0 refills | Status: AC | PRN

## 2019-03-12 MED FILL — Heparin Sodium (Porcine) Inj 1000 Unit/ML: INTRAMUSCULAR | Qty: 30 | Status: AC

## 2019-03-12 MED FILL — Sodium Chloride IV Soln 0.9%: INTRAVENOUS | Qty: 1000 | Status: AC

## 2019-03-12 NOTE — Anesthesia Postprocedure Evaluation (Signed)
Anesthesia Post Note  Patient: Ricky Norris  Procedure(s) Performed: LUMBAR FOUR TO FIVE, LUMBAR FIVE TO SACRUM ONE ANTERIOR LUMBAR INTERBODY FUSION WITH INSTRUMENTATION AND ALLOGRAFT (N/A Spine Lumbar) ABDOMINAL EXPOSURE (N/A Abdomen)     Patient location during evaluation: PACU Anesthesia Type: General Level of consciousness: awake and alert Pain management: pain level controlled Vital Signs Assessment: post-procedure vital signs reviewed and stable Respiratory status: spontaneous breathing, nonlabored ventilation, respiratory function stable and patient connected to nasal cannula oxygen Cardiovascular status: blood pressure returned to baseline and stable Postop Assessment: no apparent nausea or vomiting Anesthetic complications: no    Last Vitals:  Vitals:   03/12/19 0749 03/12/19 1116  BP: 126/80 (!) 97/48  Pulse: 91 95  Resp: 18 18  Temp: 36.9 C 36.9 C  SpO2: 99% 100%    Last Pain:  Vitals:   03/12/19 1116  TempSrc: Oral  PainSc:                  Teana Lindahl S

## 2019-03-12 NOTE — Evaluation (Signed)
Physical Therapy Evaluation Patient Details Name: Ricky CopaKevin Zobel MRN: [id number] DOB: 09/13/1986 Today's Date: 03/12/2019   History of Present Illness  Patient is a 33 y/o male who presents s/p 2 stage surgery, L3-4, L5-S1 ALIF and L4-S1 posterior fusion with pelvic fixation. No PMH.  Clinical Impression  Patient presents with pain and post surgical deficits s/p above surgery. Tolerated bed mobility, transfers and gait training with Min guard-supervision for safety. Slow to rise to stand due to pain and weakness in BLEs. Pt independent PTA and lives with friend who can provide intermittent assist. Education re: back precautions, brace, positioning, log roll technique, etc. Pt eager to return home today. Will follow acutely to maximize independence and mobility prior to return home.    Follow Up Recommendations No PT follow up;Supervision - Intermittent    Equipment Recommendations  None recommended by PT    Recommendations for Other Services       Precautions / Restrictions Precautions Precautions: Back Precaution Booklet Issued: No Precaution Comments: Reviewed back precautions. Required Braces or Orthoses: Spinal Brace Spinal Brace: Lumbar corset;Thoracolumbosacral orthotic Restrictions Weight Bearing Restrictions: No      Mobility  Bed Mobility Overal bed mobility: Needs Assistance Bed Mobility: Rolling;Sidelying to Sit Rolling: Supervision Sidelying to sit: Supervision       General bed mobility comments: HOB flat, no use of rails to simulate home. Cues for log roll technique.  Transfers Overall transfer level: Needs assistance Equipment used: None Transfers: Sit to/from Stand Sit to Stand: Min guard         General transfer comment: Min guard for safety. Pt slow to rise with wide BoS and walking hands up thighs to stand fully upright. Transferred to chair post ambulation. Cues for technique.  Ambulation/Gait Ambulation/Gait assistance: Supervision Gait  Distance (Feet): 500 Feet Assistive device: None Gait Pattern/deviations: Step-through pattern;Decreased stride length Gait velocity: decreased   General Gait Details: Slow, guarded and mostly steady gait with decreased foot clearance LLE. Cues to adhere to precautions during mobility.  Stairs            Wheelchair Mobility    Modified Rankin (Stroke Patients Only)       Balance Overall balance assessment: Needs assistance Sitting-balance support: Feet supported;No upper extremity supported Sitting balance-Leahy Scale: Good     Standing balance support: During functional activity Standing balance-Leahy Scale: Fair Standing balance comment: Requires assist to donn brace in standing- prefers to donn in standing than sitting.                             Pertinent Vitals/Pain Pain Assessment: 0-10 Pain Score: 9  Pain Location: back and abdomen is 3 Pain Descriptors / Indicators: Operative site guarding;Sore;Aching;Discomfort Pain Intervention(s): Monitored during session;Repositioned    Home Living Family/patient expects to be discharged to:: Private residence Living Arrangements: Non-relatives/Friends(best friend) Available Help at Discharge: Friend(s);Available PRN/intermittently Type of Home: Mobile home Home Access: Ramped entrance     Home Layout: One level Home Equipment: Cane - single point      Prior Function Level of Independence: Independent         Comments: 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     Hand Dominance        Extremity/Trunk Assessment   Upper Extremity Assessment Upper Extremity Assessment: Defer to OT evaluation    Lower Extremity Assessment Lower Extremity Assessment: Generalized weakness(Grossly ~3+/5 throughout with decreased foot clearance Left>Right; sensation WFL BLEs.)  Cervical / Trunk Assessment Cervical / Trunk Assessment: Other exceptions Cervical / Trunk Exceptions: s/p spine  surgery  Communication   Communication: No difficulties  Cognition Arousal/Alertness: Awake/alert Behavior During Therapy: WFL for tasks assessed/performed Overall Cognitive Status: Within Functional Limits for tasks assessed                                        General Comments General comments (skin integrity, edema, etc.): Dressing-clean dry and intact    Exercises     Assessment/Plan    PT Assessment Patient needs continued PT services  PT Problem List Decreased balance;Decreased strength;Decreased knowledge of precautions;Pain;Decreased skin integrity       PT Treatment Interventions Functional mobility training;Balance training;Patient/family education;Gait training;Therapeutic activities;Therapeutic exercise    PT Goals (Current goals can be found in the Care Plan section)  Acute Rehab PT Goals Patient Stated Goal: to go home today and sleep in my own bed PT Goal Formulation: With patient Time For Goal Achievement: 03/26/19 Potential to Achieve Goals: Good    Frequency Min 5X/week   Barriers to discharge Decreased caregiver support friend works but will be around some    Co-evaluation               AM-PAC PT "6 Clicks" Mobility  Outcome Measure Help needed turning from your back to your side while in a flat bed without using bedrails?: None Help needed moving from lying on your back to sitting on the side of a flat bed without using bedrails?: None Help needed moving to and from a bed to a chair (including a wheelchair)?: None Help needed standing up from a chair using your arms (e.g., wheelchair or bedside chair)?: None Help needed to walk in hospital room?: A Little Help needed climbing 3-5 steps with a railing? : A Little 6 Click Score: 22    End of Session Equipment Utilized During Treatment: Gait belt;Back brace Activity Tolerance: Patient tolerated treatment well Patient left: in chair;with call bell/phone within reach Nurse  Communication: Mobility status PT Visit Diagnosis: Pain;Muscle weakness (generalized) (M62.81);Difficulty in walking, not elsewhere classified (R26.2) Pain - part of body: (back and abdomen)    Time: 6387-5643 PT Time Calculation (min) (ACUTE ONLY): 27 min   Charges:   PT Evaluation $PT Eval Low Complexity: 1 Low PT Treatments $Gait Training: 8-22 mins        Mylo Red, PT, DPT Acute Rehabilitation Services Pager [phone] Office [phone]      Blake Divine A Lanier Ensign 03/12/2019, 8:42 AM

## 2019-03-12 NOTE — Progress Notes (Signed)
Patient discharged home per order, with no c/o pain at this time. Patient stated understanding of discharged instructions given. Patient has an appointment with Dr. Yevette Edwards in two weeks.

## 2019-03-12 NOTE — Progress Notes (Signed)
Occupational Therapy Evaluation Patient Details Name: Ricky CopaKevin Erbes MRN: [id number] DOB: 03/18/1986 Today's Date: 03/12/2019    History of Present Illness Patient is a 33 y/o male who presents s/p 2 stage surgery, L3-4, L5-S1 ALIF and L4-S1 posterior fusion with pelvic fixation. No PMH.   Clinical Impression   Pt presenting with post surgical pain, limiting his ADL performance.  He is able to perform functional mobility at supervision level and requires min assist with LB ADLs.  He is slow to complete Pt does have a reacher at home and will have assist/support from friends. Therapist completed ADL and back precaution education. Anticipate discharge home later today. Will sign off.     Follow Up Recommendations  No OT follow up;Supervision/Assistance - 24 hour    Equipment Recommendations  None recommended by OT    Recommendations for Other Services       Precautions / Restrictions Precautions Precautions: Back Precaution Booklet Issued: Yes (comment) Precaution Comments: Reviewed back precautions. Required Braces or Orthoses: Spinal Brace Spinal Brace: Lumbar corset;Thoracolumbosacral orthotic Restrictions Weight Bearing Restrictions: No      Mobility Bed Mobility Overal bed mobility: Needs Assistance Bed Mobility: Sit to Sidelying;Rolling;Sidelying to Sit Rolling: Supervision Sidelying to sit: Supervision     Sit to sidelying: Min assist General bed mobility comments: Verbal cues for log roll technique. Min assist to bring LEs into bed with sit to sidelying.   Transfers Overall transfer level: Needs assistance Equipment used: None Transfers: Sit to/from Stand Sit to Stand: Supervision         General transfer comment: Wide base of support to transition from sit to stand. Very slow due to pain.     Balance Overall balance assessment: Needs assistance Sitting-balance support: Feet supported;No upper extremity supported Sitting balance-Leahy Scale: Good      Standing balance support: During functional activity Standing balance-Leahy Scale: Fair Standing balance comment: Requires assist to donn brace in standing- prefers to donn in standing than sitting.                           ADL either performed or assessed with clinical judgement   ADL Overall ADL's : Needs assistance/impaired Eating/Feeding: Independent;Sitting   Grooming: Wash/dry hands;Supervision/safety;Standing   Upper Body Bathing: Supervision/ safety;Sitting   Lower Body Bathing: Minimal assistance;Sit to/from stand   Upper Body Dressing : Supervision/safety;Sitting   Lower Body Dressing: Minimal assistance;Sit to/from stand   Toilet Transfer: Supervision/safety;Ambulation;Comfort height toilet   Toileting- Clothing Manipulation and Hygiene: Supervision/safety;Sit to/from stand       Functional mobility during ADLs: Supervision/safety General ADL Comments: Pt moves slowly and is guarded due to pain. Unable to bring ankle up to access foot (donning/doffing sock) due to pain. Discussed use of reacher for LB dressing.  Therapist provided back precautions handout and further discussed techniques to maintain precautions during ADLs.      Vision         Perception     Praxis      Pertinent Vitals/Pain Pain Assessment: 0-10 Pain Score: 8  Pain Location: back Pain Descriptors / Indicators: Operative site guarding;Sore;Aching;Discomfort Pain Intervention(s): Monitored during session;Repositioned     Hand Dominance     Extremity/Trunk Assessment Upper Extremity Assessment Upper Extremity Assessment: Overall WFL for tasks assessed   Lower Extremity Assessment Lower Extremity Assessment: Defer to PT evaluation   Cervical / Trunk Assessment Cervical / Trunk Assessment: Other exceptions Cervical / Trunk Exceptions: s/p spine surgery   Communication  Communication Communication: No difficulties   Cognition Arousal/Alertness: Awake/alert Behavior  During Therapy: WFL for tasks assessed/performed Overall Cognitive Status: Within Functional Limits for tasks assessed                                     General Comments  Dressing-clean dry and intact    Exercises     Shoulder Instructions      Home Living Family/patient expects to be discharged to:: Private residence Living Arrangements: Non-relatives/Friends Available Help at Discharge: Friend(s);Available PRN/intermittently Type of Home: Mobile home Home Access: Ramped entrance     Home Layout: One level     Bathroom Shower/Tub: Chief Strategy Officer: Standard     Home Equipment: Cane - single point(reacher)          Prior Functioning/Environment Level of Independence: Independent        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        OT Problem List: Impaired balance (sitting and/or standing);Pain;Decreased knowledge of precautions;Decreased knowledge of use of DME or AE      OT Treatment/Interventions:      OT Goals(Current goals can be found in the care plan section) Acute Rehab OT Goals Patient Stated Goal: to go home today and sleep in my own bed  OT Frequency:     Barriers to D/C:            Co-evaluation              AM-PAC OT "6 Clicks" Daily Activity     Outcome Measure Help from another person eating meals?: None Help from another person taking care of personal grooming?: None Help from another person toileting, which includes using toliet, bedpan, or urinal?: A Little Help from another person bathing (including washing, rinsing, drying)?: A Little Help from another person to put on and taking off regular upper body clothing?: None Help from another person to put on and taking off regular lower body clothing?: A Little 6 Click Score: 21   End of Session Nurse Communication: Mobility status  Activity Tolerance: Patient tolerated treatment well Patient left: in bed;with call  bell/phone within reach  OT Visit Diagnosis: Unsteadiness on feet (R26.81);Pain Pain - part of body: (back)                Time: 4037-0964 OT Time Calculation (min): 25 min Charges:  OT General Charges $OT Visit: 1 Visit OT Evaluation $OT Eval Low Complexity: 1 Low OT Treatments $Self Care/Home Management : 8-22 mins    Cipriano Mile OTR/L Acute Rehabilitation Services [phone] 03/12/2019, 11:20 AM

## 2019-03-12 NOTE — Progress Notes (Signed)
    Patient doing well resting in bed S/P OT. He has done well with PT/OT this morning and is eager to go home and has been cleared to. He reports moderate to severe LBP as expected. He is tolerating the meds well. + passing gas and urination/BM. Good appetite.   Physical Exam: Vitals:   03/12/19 0345 03/12/19 0749  BP: 126/74 126/80  Pulse: 98 91  Resp: 18 18  Temp: 99.8 F (37.7 C) 98.5 F (36.9 C)  SpO2: 99% 99%    Dressing in place ANT and POST CDI, pt appears comfortable  NVI  POD #1/2 s/p ANT/POST L4-S1 Fusion doing well with expected PO LBP  - up with PT/OT, encourage ambulation   -cleared from their standpoint to d/c home  - Percocet for pain, Valium for muscle spasms  -scripts sent electronically to his pharmacy  - d/c home today with f/u in 2 weeks  - D/C orders in chart

## 2019-03-24 NOTE — Discharge Summary (Signed)
Patient ID: Ricky Norris MRN: [id number] DOB/AGE: 03/26/1986 33 y.o.  Admit date: 03/10/2019 Discharge date: 03/12/2019  Admission Diagnoses:  Active Problems:   Radiculopathy   Discharge Diagnoses:  Same  Past Medical History:  Diagnosis Date  . Low back pain   . 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   . Right leg pain     Surgeries: Procedure(s): LUMBAR 4- SACRUM 1 WITH PELVIC FIXATION POSTERIOR SPINAL FUSION WITH NSTRUMENTATION AND ALLOGRAFT on 03/11/2019   Consultants:  Dr Arbie Cookey, vascular surgery  Discharged Condition: Improved  Hospital Course: Ricky Norris is an 33 y.o. male who was admitted 03/10/2019 for operative treatment of radiculopathy. Patient has severe unremitting pain that affects sleep, daily activities, and work/hobbies. After pre-op clearance the patient was taken to the operating room on 03/11/2019 and underwent  Procedure(s): LUMBAR 4- SACRUM 1 WITH PELVIC FIXATION POSTERIOR SPINAL FUSION WITH NSTRUMENTATION AND ALLOGRAFT.    Patient was given perioperative antibiotics:  Anti-infectives (From admission, onward)   Start     Dose/Rate Route Frequency Ordered Stop   03/10/19 2000  ceFAZolin (ANCEF) IVPB 2g/100 mL premix     2 g 200 mL/hr over 30 Minutes Intravenous Every 8 hours 03/10/19 1351 03/11/19 0330   03/10/19 0600  ceFAZolin (ANCEF) IVPB 2g/100 mL premix     2 g 200 mL/hr over 30 Minutes Intravenous On call to O.R. 03/10/19 0535 03/10/19 1222       Patient was given sequential compression devices, early ambulation to prevent DVT.  Patient benefited maximally from hospital stay and there were no complications.    Recent vital signs: BP (!) 97/48 (BP Location: Right Arm)   Pulse 95   Temp 98.4 F (36.9 C) (Oral)   Resp 18   Ht 5\' 11"  (1.803 m)   Wt 80.2 kg   SpO2 100%   BMI 24.64 kg/m    Discharge Medications:   Allergies as of 03/12/2019   No  Known Allergies     Medication List    TAKE these medications   diazepam 5 MG tablet Commonly known as:  VALIUM Take 1 tablet (5 mg total) by mouth every 6 (six) hours as needed for muscle spasms.   ondansetron 4 MG tablet Commonly known as:  ZOFRAN Take 1 tablet (4 mg total) by mouth every 6 (six) hours as needed for nausea or vomiting.   oxyCODONE-acetaminophen 5-325 MG tablet Commonly known as:  PERCOCET/ROXICET Take 1-2 tablets by mouth every 4 (four) hours as needed for moderate pain or severe pain.       Diagnostic Studies: Dg Lumbar Spine 2-3 Views  Result Date: 03/11/2019 CLINICAL DATA:  Lumbar fixation EXAM: LUMBAR SPINE - 2-3 VIEW; DG C-ARM 61-120 MIN COMPARISON:  03/10/2019 FLUOROSCOPY TIME:  Fluoroscopy Time:  10 minutes 11 seconds Radiation Exposure Index (if provided by the fluoroscopic device): Not available Number of Acquired Spot Images: 2 FINDINGS: Pedicle screws are now seen at L4, L5 and S1 as well as screws extending into the sacrum bilaterally at S2. Posterior fixation is seen. Previously placed fusion at L4-5 and L5-S1 is noted. IMPRESSION: Lumbar fusion from L4-S1. Electronically Signed   By: Alcide Clever M.D.   On: 03/11/2019 13:29   Dg Lumbar Spine 2-3 Views  Result Date: 03/10/2019 CLINICAL DATA:  L4-S1 ALIF. EXAM: DG C-ARM 61-120 MIN; LUMBAR SPINE - 2-3 VIEW COMPARISON:  Lumbar spine MRI dated February 02, 2019. FLUOROSCOPY TIME:  49 seconds. C-arm fluoroscopic images were obtained intraoperatively and submitted for post operative interpretation. FINDINGS: Frontal and lateral intraoperative fluoroscopic images demonstrate interval L4-L5 and L5-S1 anterior and interbody fusion. Alignment at L5-S1 has improved with some residual grade 1 anterolisthesis. No acute osseous abnormality. IMPRESSION: 1. Intraoperative fluoroscopic guidance for L4-S1 ALIF. Electronically Signed   By: Obie Dredge M.D.   On: 03/10/2019 12:38   Dg C-arm 1-60 Min  Result Date: 03/11/2019  CLINICAL DATA:  Lumbar fixation EXAM: LUMBAR SPINE - 2-3 VIEW; DG C-ARM 61-120 MIN COMPARISON:  03/10/2019 FLUOROSCOPY TIME:  Fluoroscopy Time:  10 minutes 11 seconds Radiation Exposure Index (if provided by the fluoroscopic device): Not available Number of Acquired Spot Images: 2 FINDINGS: Pedicle screws are now seen at L4, L5 and S1 as well as screws extending into the sacrum bilaterally at S2. Posterior fixation is seen. Previously placed fusion at L4-5 and L5-S1 is noted. IMPRESSION: Lumbar fusion from L4-S1. Electronically Signed   By: Alcide Clever M.D.   On: 03/11/2019 13:29   Dg C-arm 1-60 Min  Result Date: 03/10/2019 CLINICAL DATA:  L4-S1 ALIF. EXAM: DG C-ARM 61-120 MIN; LUMBAR SPINE - 2-3 VIEW COMPARISON:  Lumbar spine MRI dated February 02, 2019. FLUOROSCOPY TIME:  49 seconds. C-arm fluoroscopic images were obtained intraoperatively and submitted for post operative interpretation. FINDINGS: Frontal and lateral intraoperative fluoroscopic images demonstrate interval L4-L5 and L5-S1 anterior and interbody fusion. Alignment at L5-S1 has improved with some residual grade 1 anterolisthesis. No acute osseous abnormality. IMPRESSION: 1. Intraoperative fluoroscopic guidance for L4-S1 ALIF. Electronically Signed   By: Obie Dredge M.D.   On: 03/10/2019 12:38   Dg Or Local Abdomen  Result Date: 03/10/2019 CLINICAL DATA:  Final instrumentation count.  Lumbar fusion. EXAM: OR LOCAL ABDOMEN COMPARISON:  Fluoroscopic images of same day. FINDINGS: The bowel gas pattern is normal. Findings consistent with surgical fusion of L4-5 and L5-S1. There is no evidence of other metallic foreign body. There is noted a faint density over left iliac wing that is suggestive of a plastic catheter hub or port, and therefore most likely external. Clinical correlation is recommended. IMPRESSION: No definite evidence of metallic foreign body other than devices related to L4-5 and L5-S1 anterior fusion. Faint density is noted over  left iliac wing that is suggestive of a plastic catheter hub or port, and therefore most likely external in position, although clinical correlation is recommended to confirm this. These results were called by telephone at the time of interpretation on 03/10/2019 at 12:11 pm to [facility], who verbally acknowledged these results. Electronically Signed   By: Lupita Raider M.D.   On: 03/10/2019 12:12    Disposition: Discharge disposition: 01-Home or Self Care       Discharge Instructions    Discharge patient   Complete by:  As directed    Discharge disposition:  01-Home or Self Care   Discharge patient date:  03/12/2019      POD #1/2 s/p ANT/POST L4-S1 Fusion doing well with expected PO LBP  - up with PT/OT, encourage ambulation              -cleared from their standpoint to d/c home  - Percocet for pain, Valium for muscle spasms             -scripts sent electronically to his pharmacy  - d/c home today with f/u in 2 weeks             -  D/C orders in chart   Signed: Georga BoraKayla J Chellsea Beckers 03/24/2019, 2:40 PM

## 2019-03-30 ENCOUNTER — Encounter (HOSPITAL_COMMUNITY): Payer: Self-pay | Admitting: Orthopedic Surgery

## 2020-11-07 IMAGING — CR OR LOCAL ABDOMEN
1 series · 1 of 1 positions shown · non-contrast
Comparison: Fluoroscopic images of same day.

CLINICAL DATA: Final instrumentation count.  Lumbar fusion.

EXAM:
OR LOCAL ABDOMEN

[AP]
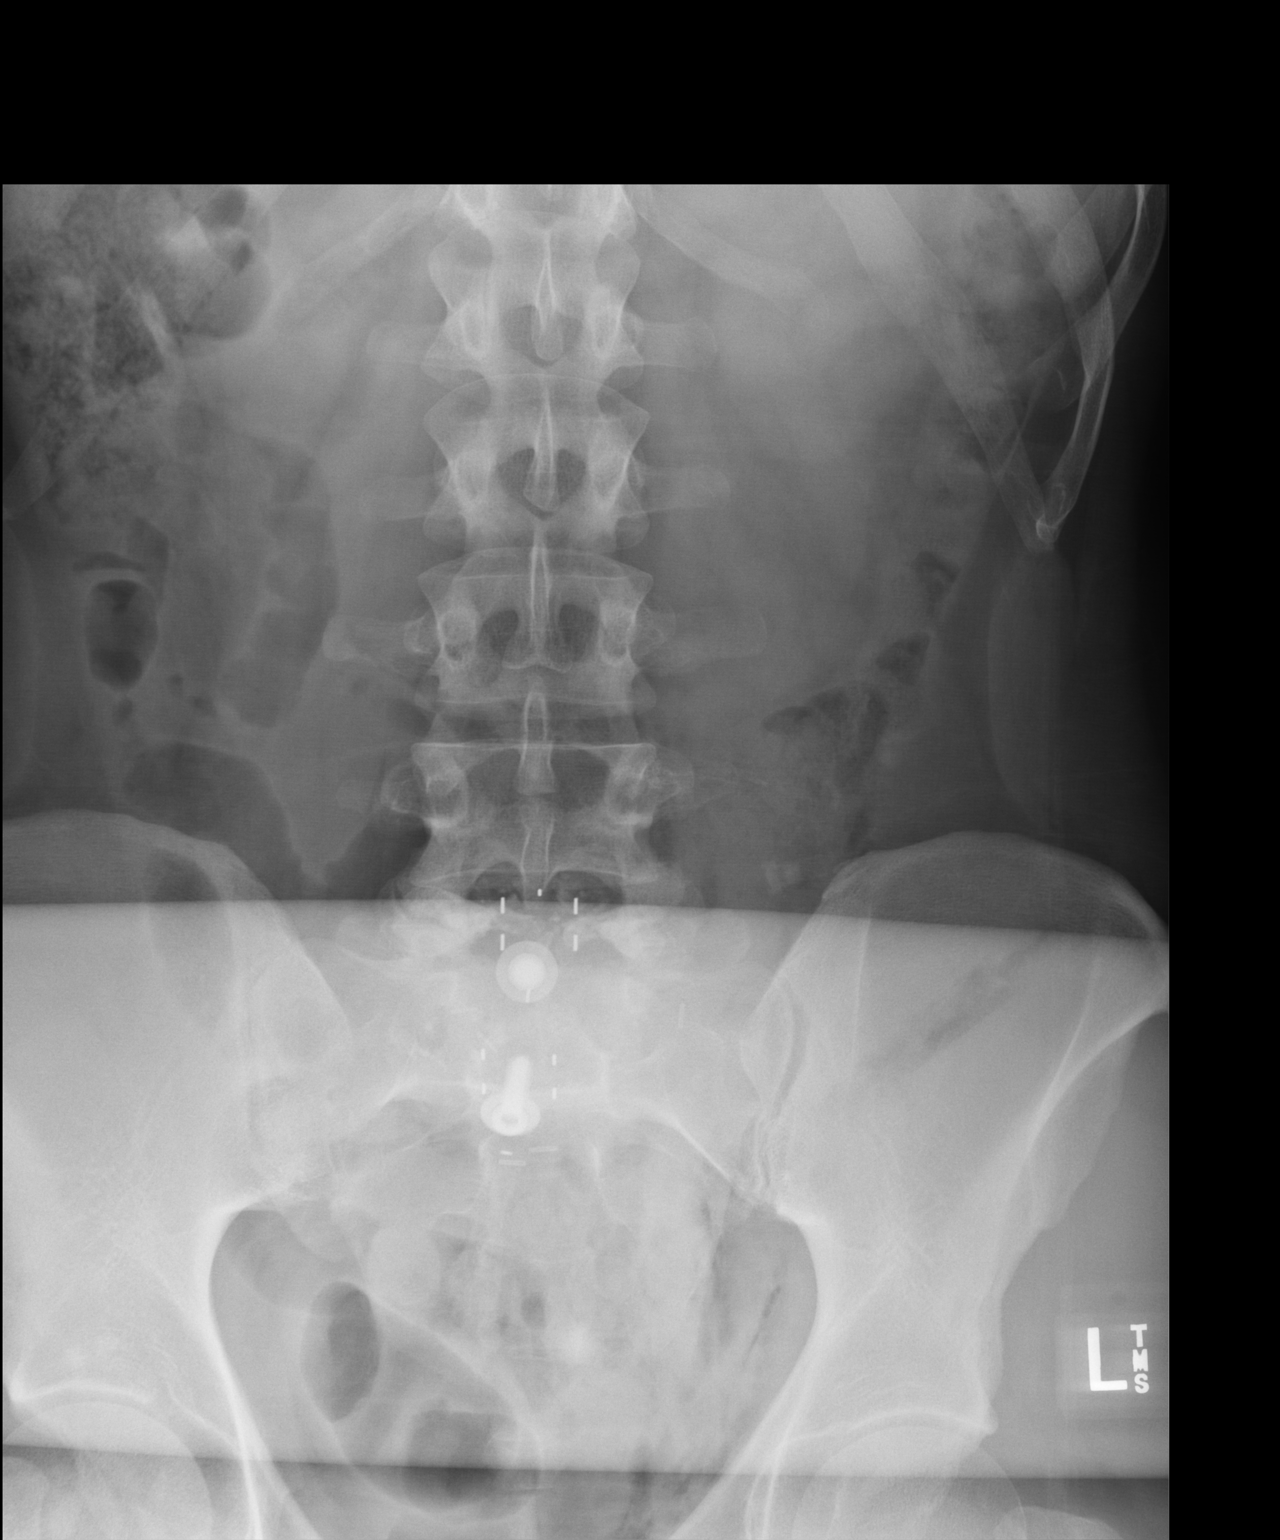

[1 of 1 positions shown; findings below may reference images not displayed]

FINDINGS: The bowel gas pattern is normal. Findings consistent with surgical
fusion of L4-5 and L5-S1. There is no evidence of other metallic
foreign body. There is noted a faint density over left iliac wing
that is suggestive of a plastic catheter hub or port, and therefore
most likely external. Clinical correlation is recommended.
IMPRESSION: No definite evidence of metallic foreign body other than devices
related to L4-5 and L5-S1 anterior fusion. Faint density is noted
over left iliac wing that is suggestive of a plastic catheter hub or
port, and therefore most likely external in position, although
clinical correlation is recommended to confirm this. These results
were called by telephone at the time of interpretation on 03/10/2019
at [DATE] to [employer], who verbally acknowledged these
results.

## 2020-11-07 IMAGING — RF LUMBAR SPINE - 2-3 VIEW
1 series · 2 of 2 positions shown · non-contrast
Comparison: Lumbar spine MRI dated February 02, 2019.

CLINICAL DATA: L4-S1 ALIF.

EXAM:
DG C-ARM 61-120 MIN; LUMBAR SPINE - 2-3 VIEW

[Series 1: run · 2 of 2 slices shown]
[im 1/2]
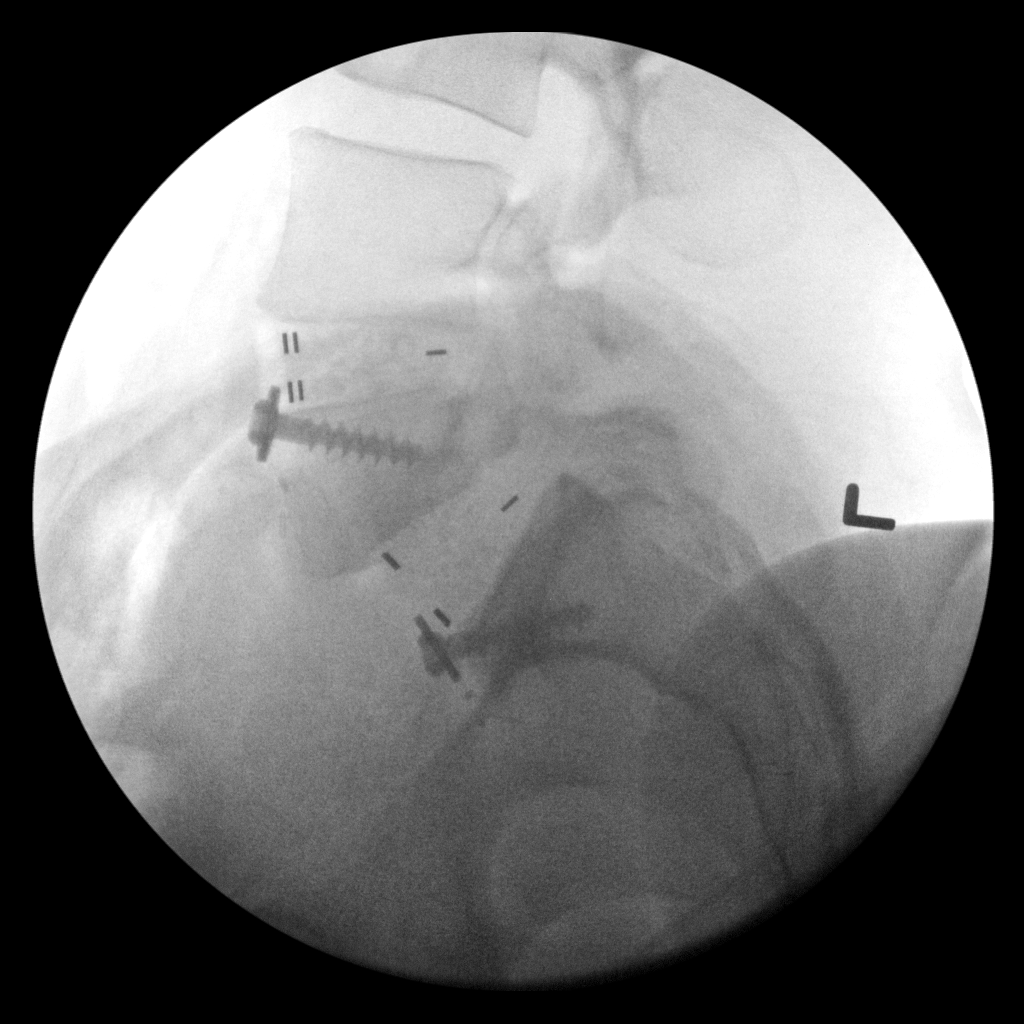
[im 2/2]
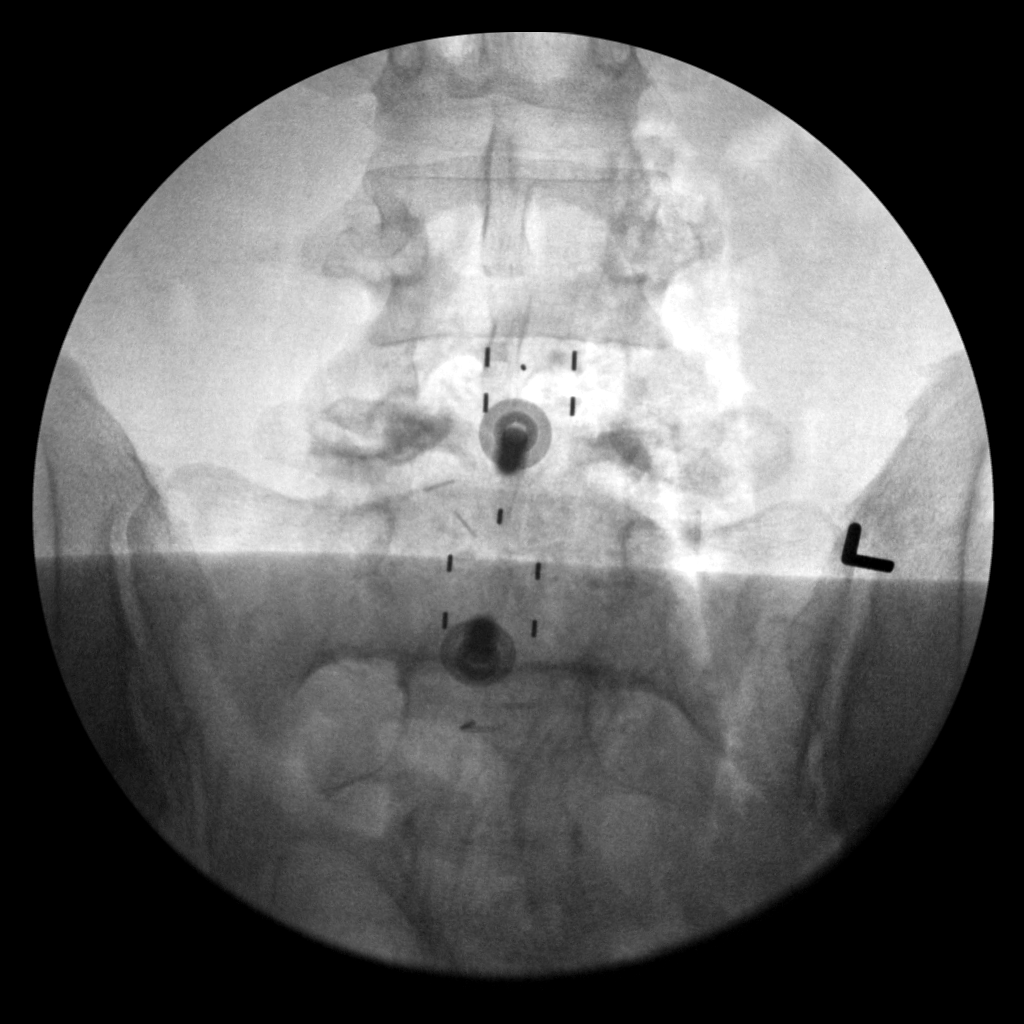

[2 of 2 positions shown; findings below may reference images not displayed]

FLUOROSCOPY TIME:  49 seconds.

C-arm fluoroscopic images were obtained intraoperatively and
submitted for post operative interpretation.
FINDINGS: Frontal and lateral intraoperative fluoroscopic images demonstrate
interval L4-L5 and L5-S1 anterior and interbody fusion. Alignment at
L5-S1 has improved with some residual grade 1 anterolisthesis. No
acute osseous abnormality.
IMPRESSION: 1. Intraoperative fluoroscopic guidance for L4-S1 ALIF.
